# Patient Record
Sex: Female | Born: 1940 | Race: White | Hispanic: Yes | Smoking: Former smoker
Health system: Southern US, Community
[De-identification: ages and names within clinical notes are randomized; demographics above are authoritative.]

## PROBLEM LIST (undated history)

## (undated) DIAGNOSIS — J449 Chronic obstructive pulmonary disease, unspecified: Secondary | ICD-10-CM

## (undated) DIAGNOSIS — Z87891 Personal history of nicotine dependence: Secondary | ICD-10-CM

## (undated) DIAGNOSIS — E039 Hypothyroidism, unspecified: Secondary | ICD-10-CM

## (undated) DIAGNOSIS — I10 Essential (primary) hypertension: Secondary | ICD-10-CM

## (undated) DIAGNOSIS — J189 Pneumonia, unspecified organism: Secondary | ICD-10-CM

## (undated) HISTORY — DX: Chronic obstructive pulmonary disease, unspecified: J44.9

## (undated) HISTORY — PX: EYE SURGERY: SHX253

## (undated) HISTORY — DX: Personal history of nicotine dependence: Z87.891

---

## 1999-10-14 DIAGNOSIS — E785 Hyperlipidemia, unspecified: Secondary | ICD-10-CM | POA: Insufficient documentation

## 2009-12-15 DIAGNOSIS — J449 Chronic obstructive pulmonary disease, unspecified: Secondary | ICD-10-CM | POA: Insufficient documentation

## 2015-10-12 DIAGNOSIS — L439 Lichen planus, unspecified: Secondary | ICD-10-CM | POA: Insufficient documentation

## 2018-10-09 DIAGNOSIS — M1811 Unilateral primary osteoarthritis of first carpometacarpal joint, right hand: Secondary | ICD-10-CM | POA: Insufficient documentation

## 2019-01-23 ENCOUNTER — Telehealth: Payer: Self-pay | Admitting: General Surgery

## 2019-01-23 NOTE — Telephone Encounter (Signed)
Patient provided contact # [phone] for Dr. Ainsley Spinner office. She stated they were supposed to send the information by priority mail.Patient wanted to let Dr. Valeta Harms know that she had an EKG done by Dr. Carlis Abbott also.  [facility] and spoke with medical records. Advised the packet only had a copy of a chest xray CD, but last office visit notes, recent labs and the EKG mentioned by the patient were not received.  She stated the fax was sent on 01/17/19. I requested for it to be faxed again as it does not appear to have been received (checked location for all consult papers, was not there, checked multiple dates. Requested it be sent urgent and put "Dr. Ambrose Pancoast" on cover sheet.  Nothing further needed at this time.

## 2019-01-24 ENCOUNTER — Other Ambulatory Visit: Payer: Self-pay

## 2019-01-24 ENCOUNTER — Encounter: Payer: Self-pay | Admitting: Pulmonary Disease

## 2019-01-24 ENCOUNTER — Ambulatory Visit (INDEPENDENT_AMBULATORY_CARE_PROVIDER_SITE_OTHER): Payer: Medicare Other | Admitting: Pulmonary Disease

## 2019-01-24 VITALS — BP 122/88 | HR 75 | Temp 98.4°F | Ht 63.0 in | Wt 148.4 lb

## 2019-01-24 DIAGNOSIS — R911 Solitary pulmonary nodule: Secondary | ICD-10-CM

## 2019-01-24 DIAGNOSIS — R0600 Dyspnea, unspecified: Secondary | ICD-10-CM | POA: Diagnosis not present

## 2019-01-24 DIAGNOSIS — J449 Chronic obstructive pulmonary disease, unspecified: Secondary | ICD-10-CM

## 2019-01-24 DIAGNOSIS — R0609 Other forms of dyspnea: Secondary | ICD-10-CM

## 2019-01-24 DIAGNOSIS — J441 Chronic obstructive pulmonary disease with (acute) exacerbation: Secondary | ICD-10-CM

## 2019-01-24 DIAGNOSIS — R0602 Shortness of breath: Secondary | ICD-10-CM

## 2019-01-24 MED ORDER — TRELEGY ELLIPTA 100-62.5-25 MCG/INH IN AEPB: 1.0000 | INHALATION_SPRAY | Freq: Every day | RESPIRATORY_TRACT | 0 refills | Status: DC

## 2019-01-24 MED ORDER — ALBUTEROL SULFATE (2.5 MG/3ML) 0.083% IN NEBU: 2.5000 mg | INHALATION_SOLUTION | RESPIRATORY_TRACT | 3 refills | Status: DC | PRN

## 2019-01-24 NOTE — Patient Instructions (Addendum)
Thank you for visiting Dr. Valeta Harms at [facility]. Today we recommend the following:  Samples of Trelegy inhaler today  Stop the Anoro during the trial of Trelegy New prescription for a nebulizer machine  Albuterol neb solution to be used in this every 4-6 hours as needed.   We will need to get copies of the CT Chest images as well as PFTs from [facility].   Return in about 6 years (around 01/23/2025).    Please do your part to reduce the spread of COVID-19.

## 2019-01-24 NOTE — Progress Notes (Signed)
Synopsis: Referred in September 2020 for COPD/lung nodules by No ref. provider found  Subjective:   PATIENT ID: Allison Zuniga GENDER: female DOB: April 17, 1941, MRN: [id number]  Chief Complaint  Patient presents with  . Consult    Consult for Lung Nodule. She reports SOB and feeling like she can't take a deep breath which started in July. She was treated with prednisone and antibiotics which did help. She just finished levaquin this week. She reports a productive cough with yellow to green mucous.     78 year old female, former smoker, secondhand smoke exposure from husband, started smoking 89 yo, 1 packs per day, for ~45 years.  She lives in [place]. Was diagnosed with COPD some where arounf 5 years agos. Recently started on Anoro this past week. She was spiriva for for >10 years. She did have PFTs with about 3 years completed at [facility].   Back in July she had an excerbation. Developed SOB and had DOE. Just completed a course of levaquin started by her PCP. Finished her abx and steroid course last week for ongoing exacerbation symptoms.  She is feeling better since her last exacerbation.  She is only been on Anoro Ellipta approximately 1 week.  She is not sure she has seen much difference in her breathing.  She is using her albuterol inhaler 1-2 times per day.  She does not have a nebulizer at home.  Denies fevers chills night sweats weight loss.  Patient's daughter is [name].  I called and spoke with her as well.  She was appreciative of the update since she could not be present with her mother today in the office due to [place] visitor restrictions.    Past Medical History:  Diagnosis Date  . COPD (chronic obstructive pulmonary disease) ([place])   . Former smoker      Family History  Problem Relation Age of Onset  . Asthma Mother      History reviewed. No pertinent surgical history.  Social History   Socioeconomic History  . Marital status: Married    Spouse name: Not on  file  . Number of children: Not on file  . Years of education: Not on file  . Highest education level: Not on file  Occupational History  . Not on file  Social Needs  . Financial resource strain: Not on file  . Food insecurity    Worry: Not on file    Inability: Not on file  . Transportation needs    Medical: Not on file    Non-medical: Not on file  Tobacco Use  . Smoking status: Former Smoker    Packs/day: 1.00    Years: 30.00    Pack years: 30.00    Quit date: 2015    Years since quitting: 5.6  . Smokeless tobacco: Former Network engineer and Sexual Activity  . Alcohol use: Not on file  . Drug use: Not on file  . Sexual activity: Not on file  Lifestyle  . Physical activity    Days per week: Not on file    Minutes per session: Not on file  . Stress: Not on file  Relationships  . Social Herbalist on phone: Not on file    Gets together: Not on file    Attends religious service: Not on file    Active member of club or organization: Not on file    Attends meetings of clubs or organizations: Not on file    Relationship status:  Not on file  . Intimate partner violence    Fear of current or ex partner: Not on file    Emotionally abused: Not on file    Physically abused: Not on file    Forced sexual activity: Not on file  Other Topics Concern  . Not on file  Social History Narrative  . Not on file     Allergies  Allergen Reactions  . Aspirin Hives  . Penicillins      Outpatient Medications Prior to Visit  Medication Sig Dispense Refill  . atenolol (TENORMIN) 50 MG tablet Take 50 mg by mouth daily.    . Cholecalciferol (VITAMIN D3) 1.25 MG (50000 UT) CAPS Take by mouth.    . irbesartan (AVAPRO) 150 MG tablet Take 150 mg by mouth daily.    Marland Kitchen levothyroxine (SYNTHROID) 75 MCG tablet Take 75 mcg by mouth daily before breakfast.    . vitamin B-12 (CYANOCOBALAMIN) 500 MCG tablet Take 500 mcg by mouth daily.     No facility-administered medications prior to  visit.     Review of Systems  Constitutional: Negative for chills, fever, malaise/fatigue and weight loss.  HENT: Negative for hearing loss, sore throat and tinnitus.   Eyes: Negative for blurred vision and double vision.  Respiratory: Positive for cough, sputum production and shortness of breath. Negative for hemoptysis, wheezing and stridor.   Cardiovascular: Negative for chest pain, palpitations, orthopnea, leg swelling and PND.  Gastrointestinal: Negative for abdominal pain, constipation, diarrhea, heartburn, nausea and vomiting.  Genitourinary: Negative for dysuria, hematuria and urgency.  Musculoskeletal: Negative for joint pain and myalgias.  Skin: Negative for itching and rash.  Neurological: Negative for dizziness, tingling, weakness and headaches.  Endo/Heme/Allergies: Negative for environmental allergies. Does not bruise/bleed easily.  Psychiatric/Behavioral: Negative for depression. The patient is nervous/anxious. The patient does not have insomnia.   All other systems reviewed and are negative.    Objective:  Physical Exam Vitals signs reviewed.  Constitutional:      General: She is not in acute distress.    Appearance: She is well-developed.  HENT:     Head: Normocephalic and atraumatic.  Eyes:     General: No scleral icterus.    Conjunctiva/sclera: Conjunctivae normal.     Pupils: Pupils are equal, round, and reactive to light.  Neck:     Musculoskeletal: Neck supple.     Vascular: No JVD.     Trachea: No tracheal deviation.  Cardiovascular:     Rate and Rhythm: Normal rate and regular rhythm.     Heart sounds: Normal heart sounds. No murmur.  Pulmonary:     Effort: Pulmonary effort is normal. No tachypnea, accessory muscle usage or respiratory distress.     Breath sounds: Normal breath sounds. No stridor. No wheezing, rhonchi or rales.  Abdominal:     General: Bowel sounds are normal. There is no distension.     Palpations: Abdomen is soft.     Tenderness:  There is no abdominal tenderness.  Musculoskeletal:        General: No tenderness.  Lymphadenopathy:     Cervical: No cervical adenopathy.  Skin:    General: Skin is warm and dry.     Capillary Refill: Capillary refill takes less than 2 seconds.     Findings: No rash.  Neurological:     Mental Status: She is alert and oriented to person, place, and time.  Psychiatric:        Behavior: Behavior normal.  Vitals:   01/24/19 1039  BP: 122/88  Pulse: 75  Temp: 98.4 F (36.9 C)  TempSrc: Temporal  SpO2: 96%  Weight: 148 lb 6.4 oz (67.3 kg)  Height: 5\' 3"  (1.6 m)   96% on  RA BMI Readings from Last 3 Encounters:  01/24/19 26.29 kg/m   Wt Readings from Last 3 Encounters:  01/24/19 148 lb 6.4 oz (67.3 kg)     CBC No results found for: WBC, RBC, HGB, HCT, PLT, MCV, MCH, MCHC, RDW, LYMPHSABS, MONOABS, EOSABS, BASOSABS   Chest Imaging:     Chest x-ray from Dr. Ainsley Spinner office.  Density within the right lower lobe, infiltrate in the left lower lobe.  Upper lobe evidence of emphysema. The patient's images have been independently reviewed by me.    Pulmonary Functions Testing Results: No flowsheet data found.  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 DOE (dyspnea on exertion)  R06.09 CANCELED: Ambulatory Referral for DME  2. SOB (shortness of breath)  R06.02 CANCELED: Ambulatory Referral for DME  3. Lung nodule  R91.1 CANCELED: Ambulatory Referral for DME  4. Dyspnea, unspecified type  R06.00   5. COPD with acute exacerbation ([place])  J44.1   6. Chronic obstructive pulmonary disease, unspecified COPD type ([place])  J44.9 For home use only DME Nebulizer machine    Discussion:  78 year old female with dyspnea on exertion.  History of COPD.  Prior PFTs completed at [facility].  Prior CT chest for evaluation of lung nodule also  completed at [facility].  Plan: Continue Anoro Ellipta.  She was recently stepped up from [place] to LAMA/LABA.  This was only a week ago.  Anora Ellipta may be enough for her. Give samples of Trelegy Ellipta.  During the trial of Trelegy she should stop the Anoro.  Patient inquired about use of Trelegy.  Therefore we will give her samples to see if she likes this better than the Anoro.  With exacerbations recently the addition of ICS in her situation is appropriate.  New prescription given for nebulizer machine, nebulized albuterol every 4 6 hours as needed for shortness of breath and wheezing. We need to obtain actual copies of the CT images that were completed of the chest. Once we review the CT images we will be able to make further recommendations regarding the lung nodule seen on chest x-ray.  We also need to obtain pulmonary function tests that were completed at [facility] as well. Pending on the timeframe of when the last PFTs were completed we will likely have those repeated as well prior to the next office visit.  Patient to see [place] again in 6 weeks to see how she is recovering from her recent COPD exacerbation.  Greater than 50% of this patient's 45-minute office visit was been face-to-face discussing above recommendations treatment plan.   Current Outpatient Medications:  .  atenolol (TENORMIN) 50 MG tablet, Take 50 mg by mouth daily., Disp: , Rfl:  .  Cholecalciferol (VITAMIN D3) 1.25 MG (50000 UT) CAPS, Take by mouth., Disp: , Rfl:  .  irbesartan (AVAPRO) 150 MG tablet, Take 150 mg by mouth daily., Disp: , Rfl:  .  levothyroxine (SYNTHROID) 75 MCG tablet, Take 75 mcg by mouth daily before breakfast., Disp: , Rfl:  .  vitamin B-12 (CYANOCOBALAMIN) 500 MCG tablet, Take 500  mcg by mouth daily., Disp: , Rfl:  .  albuterol (PROVENTIL) (2.5 MG/3ML) 0.083% nebulizer solution, Take 3 mLs (2.5 mg total) by nebulization every 4 (four) hours as needed for wheezing or shortness of  breath (inhaler 17ml every hours and as needed for sob and wheezing)., Disp: 360 mL, Rfl: 3 .  Fluticasone-Umeclidin-Vilant (TRELEGY ELLIPTA) 100-62.5-25 MCG/INH AEPB, Inhale 1 puff into the lungs daily., Disp: 1 each, Rfl: 0   Garner Nash, DO [facility] 01/24/2019 3:12 PM

## 2019-01-27 ENCOUNTER — Telehealth: Payer: Self-pay | Admitting: Pulmonary Disease

## 2019-01-27 NOTE — Telephone Encounter (Signed)
Message routed to Dr. Valeta Harms.  I called the patient back and she said the Trelegy made her feel terrible, was not able to get a deep breath and said it did not make her feel good at all and made her chest felt heavy. Went back on the [employer].  She wants to know if she was to continue using the ventolin and albuterol solution? Should they be used along with the Anoro? If she should use both the Ventolin an albuterol solution, how closely can they be used together?

## 2019-01-28 NOTE — Telephone Encounter (Signed)
The albuterol can be used is either form. The ventolin inhaler of the nebulizer every 4-6 hours as needed.   I have serious doubt the trelegy made these symptoms. Trelegy contains the exact same medication as Anoro Except for the addition of an inhaled steroid which is not a short acting medication and takes days of use to see any changes.  Thanks  BLI

## 2019-01-28 NOTE — Telephone Encounter (Signed)
Call made to patient, made aware of BI recommendations. Voiced understanding. Nothing further needed at this time. She also states she also requested a disc of her EKG to be sent as well. Made her aware I would let BI know. Voiced understanding. Nothing further needed at this time.

## 2019-01-31 ENCOUNTER — Telehealth: Payer: Self-pay | Admitting: Pulmonary Disease

## 2019-01-31 NOTE — Telephone Encounter (Signed)
Call returned to patient, she wanted to be sure we requested her CT disc as well as other records. I made her aware the request has been faxed.She reports she called and they have not received it. I made her aware I would re-fax the request. Voiced understanding. Nothing further needed at this time.

## 2019-02-05 ENCOUNTER — Telehealth: Payer: Self-pay

## 2019-02-05 DIAGNOSIS — R0602 Shortness of breath: Secondary | ICD-10-CM

## 2019-02-05 DIAGNOSIS — J449 Chronic obstructive pulmonary disease, unspecified: Secondary | ICD-10-CM

## 2019-02-05 MED ORDER — TRELEGY ELLIPTA 100-62.5-25 MCG/INH IN AEPB: 1.0000 | INHALATION_SPRAY | Freq: Once | RESPIRATORY_TRACT | 6 refills | Status: AC

## 2019-02-05 NOTE — Telephone Encounter (Signed)
-----   Message from Garner Nash, DO sent at 02/04/2019  5:30 PM EDT ----- Regarding: Allison Zuniga,   The patients daughter (my neighbor) told me that she brought a copy of the CD with the cat scan on it to our office.   Please, look for the cd and once you find it please call the daughter [name] that we have it and I will take a look at it when I am back in the office. Let her know that I am working in the hospital until oct 5th I believe.   Also, it would be good to call and check on the patient if you could she told me she had to go to her PCP again for SOB.   Thanks  Leory Plowman   I hope everything is going well.

## 2019-02-05 NOTE — Telephone Encounter (Signed)
PCCM:  CT Scan from [facility] in [place] was reviewed.   Areas of tree-in-bud opacities, bronchiectasis and scaring the bases. Possible resolving pneumonia.  Also, posterior left lower lobe soft tissue lesion in the base that needs to be followed up. Also, 64mm RLL nodule that needs 3 month follow up.   We need the images loaded in PACs for radiology to be able to compare. I will leave this CD in my box on my desk for upload.   Patient to follow up in clinic after HRCT complete in 3 months. New order placed for this.   Patient would also like to establish with a [facility]ologist. I have placed this referral. 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   I called and spoke with the patient as well as her daughter [name].   New refill for trelegy sent to [facility] 02/05/2019 2:17 PM

## 2019-02-05 NOTE — Telephone Encounter (Signed)
Call made to patient, she reports she has not seen her PCP following her visit after seeing Dr. Valeta Harms. She report she did go and have a covid test on Monday, still awaiting results. She reports she has not had any other symptoms. She report she is just having a lot of anxiety while waiting on review of the CT disc. She reports she is stable for now just having a lot of anxiety. Made aware I would let BI know. She also states she is aware you BI is not in clinic everyday and she asked when BI would be back in clinic. I explained that he is due back in clinic in 10/5 but that does not mean he will not be able to view the disc until then. She became overly anxious explaining how far away that is to wait and is there anything else we can do in the meantime. I again inquired as to whether she felt she needed to be seen again, she reports no she wanted to wait until BI reviewed the disc. She reports she is using the Trelegy and the nebulizer. Able to speak complete sentences. Denies cough. Reports her SOB has not gotten any worse since seeing BI. Denies wheezing. I again explained if she needed to be seen just call. She was reluctant to respond. I made her aware I would get this message to [facility], she states please get it to him as quick as you can.

## 2019-02-21 ENCOUNTER — Other Ambulatory Visit: Payer: Self-pay

## 2019-02-21 ENCOUNTER — Ambulatory Visit
Admission: RE | Admit: 2019-02-21 | Discharge: 2019-02-21 | Disposition: A | Discharge status: Home / Self Care | Payer: Self-pay | Source: Ambulatory Visit | Attending: Pulmonary Disease | Admitting: Pulmonary Disease

## 2019-02-21 DIAGNOSIS — R0600 Dyspnea, unspecified: Secondary | ICD-10-CM

## 2019-02-21 DIAGNOSIS — R0609 Other forms of dyspnea: Secondary | ICD-10-CM

## 2019-03-12 ENCOUNTER — Ambulatory Visit (INDEPENDENT_AMBULATORY_CARE_PROVIDER_SITE_OTHER): Payer: Medicare Other | Admitting: Pulmonary Disease

## 2019-03-12 ENCOUNTER — Other Ambulatory Visit: Payer: Self-pay

## 2019-03-12 ENCOUNTER — Encounter: Payer: Self-pay | Admitting: Pulmonary Disease

## 2019-03-12 VITALS — BP 144/70 | HR 79 | Temp 97.2°F | Ht 63.0 in | Wt 149.6 lb

## 2019-03-12 DIAGNOSIS — M791 Myalgia, unspecified site: Secondary | ICD-10-CM | POA: Diagnosis not present

## 2019-03-12 DIAGNOSIS — R911 Solitary pulmonary nodule: Secondary | ICD-10-CM | POA: Diagnosis not present

## 2019-03-12 DIAGNOSIS — J42 Unspecified chronic bronchitis: Secondary | ICD-10-CM | POA: Diagnosis not present

## 2019-03-12 DIAGNOSIS — T887XXA Unspecified adverse effect of drug or medicament, initial encounter: Secondary | ICD-10-CM

## 2019-03-12 DIAGNOSIS — J449 Chronic obstructive pulmonary disease, unspecified: Secondary | ICD-10-CM | POA: Diagnosis not present

## 2019-03-12 NOTE — Progress Notes (Signed)
Synopsis: Referred in September 2020 for COPD/lung nodules by Mosetta Anis, MD  Subjective:   PATIENT ID: Allison Zuniga GENDER: female DOB: 1941-04-07, MRN: [id number]  Chief Complaint  Patient presents with   Follow-up    78 year old female, former smoker, secondhand smoke exposure from husband, started smoking 37 yo, 1 packs per day, for ~45 years.  She lives in [place]. Was diagnosed with COPD some where arounf 5 years agos. Recently started on Anoro this past week. She was spiriva for for >10 years. She did have PFTs with about 3 years completed at [facility].   Back in July she had an excerbation. Developed SOB and had DOE. Just completed a course of levaquin started by her PCP. Finished her abx and steroid course last week for ongoing exacerbation symptoms.  She is feeling better since her last exacerbation.  She is only been on Anoro Ellipta approximately 1 week.  She is not sure she has seen much difference in her breathing.  She is using her albuterol inhaler 1-2 times per day.  She does not have a nebulizer at home.  Denies fevers chills night sweats weight loss.  Patient's daughter is [name].  I called and spoke with her as well.  She was appreciative of the update since she could not be present with her mother today in the office due to [place] visitor restrictions.   OV 03/12/2019: Here today for follow-up regarding COPD management.  Since last office visit was able to obtain CT imaging from [facility] in [place].  Review of the images revealed some areas of tree-in-bud opacities mild bronchiectasis and scarring at the bases.  She was also found to have an 8 mm right lower lobe lung nodule.  We discussed this via telephone back in September.  She has planned HRCT follow-up in 3 months.  Echocardiogram revealed mild AI.  She was referred to cardiology.  This appointment is scheduled for October 30.  Patient doing well today.  She states that she is breathing much  better than where she was before.  She likes being on the Trelegy.  She does feel as if she has having recurrent muscle cramps in her legs and lower back and hips.  She is worried that this may be related to the initiation of the Trelegy inhaler.  However she is breathing much better than she was and does not want to go off if at all possible.   Past Medical History:  Diagnosis Date   COPD (chronic obstructive pulmonary disease) ([place])    Former smoker      Family History  Problem Relation Age of Onset   Asthma Mother      No past surgical history on file.  Social History   Socioeconomic History   Marital status: Married    Spouse name: Not on file   Number of children: Not on file   Years of education: Not on file   Highest education level: Not on file  Occupational History   Not on file  Social Needs   Financial resource strain: Not on file   Food insecurity    Worry: Not on file    Inability: Not on file   Transportation needs    Medical: Not on file    Non-medical: Not on file  Tobacco Use   Smoking status: Former Smoker    Packs/day: 1.00    Years: 30.00    Pack years: 30.00    Quit date: 2015  Years since quitting: 5.8   Smokeless tobacco: Former Network engineer and Sexual Activity   Alcohol use: Not on file   Drug use: Not on file   Sexual activity: Not on file  Lifestyle   Physical activity    Days per week: Not on file    Minutes per session: Not on file   Stress: Not on file  Relationships   Social connections    Talks on phone: Not on file    Gets together: Not on file    Attends religious service: Not on file    Active member of club or organization: Not on file    Attends meetings of clubs or organizations: Not on file    Relationship status: Not on file   Intimate partner violence    Fear of current or ex partner: Not on file    Emotionally abused: Not on file    Physically abused: Not on file    Forced sexual activity:  Not on file  Other Topics Concern   Not on file  Social History Narrative   Not on file     Allergies  Allergen Reactions   Aspirin Hives   Penicillins      Outpatient Medications Prior to Visit  Medication Sig Dispense Refill   albuterol (VENTOLIN HFA) 108 (90 Base) MCG/ACT inhaler Inhale into the lungs every 6 (six) hours as needed for wheezing or shortness of breath.     atenolol (TENORMIN) 50 MG tablet Take 50 mg by mouth 2 (two) times daily.      Cholecalciferol (VITAMIN D3) 1.25 MG (50000 UT) CAPS Take by mouth.     Cyanocobalamin 1000 MCG SUBL Place 1,000 mcg under the tongue daily.     Fluticasone-Umeclidin-Vilant (TRELEGY ELLIPTA) 100-62.5-25 MCG/INH AEPB Inhale 1 puff into the lungs daily. 1 each 0   irbesartan (AVAPRO) 150 MG tablet Take 150 mg by mouth daily.     levothyroxine (SYNTHROID) 75 MCG tablet Take 75 mcg by mouth daily before breakfast.     LORazepam (ATIVAN) 0.5 MG tablet TAKE 1 TABLET BY MOUTH ONCE DAILY AS NEEDED FOR ANXIETY     cyanocobalamin 1000 MCG tablet Take 1,000 mcg by mouth daily.     vitamin B-12 (CYANOCOBALAMIN) 500 MCG tablet Take 500 mcg by mouth daily.     albuterol (PROVENTIL) (2.5 MG/3ML) 0.083% nebulizer solution Take 3 mLs (2.5 mg total) by nebulization every 4 (four) hours as needed for wheezing or shortness of breath (inhaler 41ml every hours and as needed for sob and wheezing). 360 mL 3   cyanocobalamin 100 MCG tablet Take by mouth.     No facility-administered medications prior to visit.     Review of Systems  Constitutional: Negative for chills, fever, malaise/fatigue and weight loss.  HENT: Negative for hearing loss, sore throat and tinnitus.   Eyes: Negative for blurred vision and double vision.  Respiratory: Positive for cough and shortness of breath. Negative for hemoptysis, sputum production, wheezing and stridor.   Cardiovascular: Negative for chest pain, palpitations, orthopnea, leg swelling and PND.    Gastrointestinal: Negative for abdominal pain, constipation, diarrhea, heartburn, nausea and vomiting.  Genitourinary: Negative for dysuria, hematuria and urgency.  Musculoskeletal: Negative for joint pain and myalgias.  Skin: Negative for itching and rash.  Neurological: Negative for dizziness, tingling, weakness and headaches.  Endo/Heme/Allergies: Negative for environmental allergies. Does not bruise/bleed easily.  Psychiatric/Behavioral: Negative for depression. The patient is not nervous/anxious and does not have insomnia.  All other systems reviewed and are negative.    Objective:  Physical Exam Vitals signs reviewed.  Constitutional:      General: She is not in acute distress.    Appearance: She is well-developed.  HENT:     Head: Normocephalic and atraumatic.  Eyes:     General: No scleral icterus.    Conjunctiva/sclera: Conjunctivae normal.     Pupils: Pupils are equal, round, and reactive to light.  Neck:     Musculoskeletal: Neck supple.     Vascular: No JVD.     Trachea: No tracheal deviation.  Cardiovascular:     Rate and Rhythm: Normal rate and regular rhythm.     Heart sounds: Normal heart sounds. No murmur.  Pulmonary:     Effort: Pulmonary effort is normal. No tachypnea, accessory muscle usage or respiratory distress.     Breath sounds: No stridor. No wheezing, rhonchi or rales.  Abdominal:     General: Bowel sounds are normal. There is no distension.     Palpations: Abdomen is soft.     Tenderness: There is no abdominal tenderness.  Musculoskeletal:        General: No tenderness.  Lymphadenopathy:     Cervical: No cervical adenopathy.  Skin:    General: Skin is warm and dry.     Capillary Refill: Capillary refill takes less than 2 seconds.     Findings: No rash.  Neurological:     Mental Status: She is alert and oriented to person, place, and time.  Psychiatric:        Behavior: Behavior normal.      Vitals:   03/12/19 1134  BP: (!) 144/70   Pulse: 79  Temp: (!) 97.2 F (36.2 C)  TempSrc: Oral  SpO2: 96%  Weight: 149 lb 9.6 oz (67.9 kg)  Height: 5\' 3"  (1.6 m)   96% on  RA BMI Readings from Last 3 Encounters:  03/12/19 26.50 kg/m  01/24/19 26.29 kg/m   Wt Readings from Last 3 Encounters:  03/12/19 149 lb 9.6 oz (67.9 kg)  01/24/19 148 lb 6.4 oz (67.3 kg)     CBC No results found for: WBC, RBC, HGB, HCT, PLT, MCV, MCH, MCHC, RDW, LYMPHSABS, MONOABS, EOSABS, BASOSABS   Chest Imaging:     Chest x-ray from Dr. Ainsley Spinner office.  Density within the right lower lobe, infiltrate in the left lower lobe.  Upper lobe evidence of emphysema. The patient's images have been independently reviewed by me.     Pulmonary Functions Testing Results: No flowsheet data found.  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 Nodule of lower lobe of right lung  R91.1   2. Chronic bronchitis, unspecified chronic bronchitis type ([place])  J42   3. Chronic obstructive pulmonary disease, unspecified COPD type ([place])  J44.9   4. Medication side effect  T88.7XXA   5. Myalgia  M79.10     Discussion:  78 year old with dyspnea on exertion, history of COPD, prior PFTs at [facility] hospital.  I do not have a report from these.  Prior CT chest was completed at [facility] which revealed a 8 mm right lower lobe lung nodule.  Plan follow-up for this lung nodule is scheduled already for November 2020.  Mild AI on echo from [facility].  Cardiology appointment pending.  Plan: Place  keep appointment with cardiology scheduled for Friday. As for the muscle cramps and myalgia symptoms.  This could very well be related to the long-acting muscarinic agent located in her Trelegy. We will change the medication due to potential side effect. We will start Breztri Patient was given samples of this medication today. She is going to  trial herself off of the Trelegy and start the new medication to see if her myalgia muscle cramp type symptoms dissipate. I will call the patient with results of her CAT scan in November. At that point we will make plans as to what to do with the lung nodule.  If stable we will continue to follow.  Return to clinic in 6 months or as needed depending on symptoms.  Greater than 50% of this patient's 25-minute of visit was spent face-to-face discussing the above recommendations and treatment plan.    Current Outpatient Medications:    albuterol (VENTOLIN HFA) 108 (90 Base) MCG/ACT inhaler, Inhale into the lungs every 6 (six) hours as needed for wheezing or shortness of breath., Disp: , Rfl:    atenolol (TENORMIN) 50 MG tablet, Take 50 mg by mouth 2 (two) times daily. , Disp: , Rfl:    Cholecalciferol (VITAMIN D3) 1.25 MG (50000 UT) CAPS, Take by mouth., Disp: , Rfl:    Cyanocobalamin 1000 MCG SUBL, Place 1,000 mcg under the tongue daily., Disp: , Rfl:    Fluticasone-Umeclidin-Vilant (TRELEGY ELLIPTA) 100-62.5-25 MCG/INH AEPB, Inhale 1 puff into the lungs daily., Disp: 1 each, Rfl: 0   irbesartan (AVAPRO) 150 MG tablet, Take 150 mg by mouth daily., Disp: , Rfl:    levothyroxine (SYNTHROID) 75 MCG tablet, Take 75 mcg by mouth daily before breakfast., Disp: , Rfl:    LORazepam (ATIVAN) 0.5 MG tablet, TAKE 1 TABLET BY MOUTH ONCE DAILY AS NEEDED FOR ANXIETY, Disp: , Rfl:    albuterol (PROVENTIL) (2.5 MG/3ML) 0.083% nebulizer solution, Take 3 mLs (2.5 mg total) by nebulization every 4 (four) hours as needed for wheezing or shortness of breath (inhaler 76ml every hours and as needed for sob and wheezing)., Disp: 360 mL, Rfl: 3   Garner Nash, DO [facility] 03/12/2019 12:03 PM

## 2019-03-12 NOTE — Patient Instructions (Addendum)
Thank you for visiting Dr. Valeta Harms at [facility]. Today we recommend the following:  Stop trelegy on while trying the new inhaler.  Let us know about your symptoms.   Return in about 6 months (around 09/10/2019) for with APP or Dr. Valeta Harms.    Please do your part to reduce the spread of COVID-19.

## 2019-03-13 ENCOUNTER — Telehealth: Payer: Self-pay | Admitting: Pulmonary Disease

## 2019-03-13 MED ORDER — BREZTRI AEROSPHERE 160-9-4.8 MCG/ACT IN AERO: 2.0000 | INHALATION_SPRAY | Freq: Two times a day (BID) | RESPIRATORY_TRACT | 1 refills | Status: DC

## 2019-03-13 NOTE — Telephone Encounter (Signed)
Spoke with patient she did not know if inhaler was 1 puff or two and how many times a day.   I instructed her on proper way to take inhaler. She voiced understanding back.   She also would like a RX sent to her pharmacy because they do not carry it and will have to order and patient doesn't know how much it will cost her,   Dr. Valeta Harms may I send RX x1 inhaler to her pharmacy so they can run price on inhaler for her

## 2019-03-13 NOTE — Telephone Encounter (Signed)
PCCM:  2 puffs twice daily Okay to send prescription to [facility] 03/13/2019 12:46 PM

## 2019-03-13 NOTE — Telephone Encounter (Signed)
Rx Sent for pharmacy to process cost for patient

## 2019-03-14 ENCOUNTER — Ambulatory Visit (INDEPENDENT_AMBULATORY_CARE_PROVIDER_SITE_OTHER): Payer: Medicare Other | Admitting: Cardiology

## 2019-03-14 ENCOUNTER — Other Ambulatory Visit: Payer: Self-pay

## 2019-03-14 ENCOUNTER — Encounter: Payer: Self-pay | Admitting: Cardiology

## 2019-03-14 VITALS — BP 138/70 | HR 82 | Temp 95.0°F | Ht 63.0 in | Wt 150.0 lb

## 2019-03-14 DIAGNOSIS — R931 Abnormal findings on diagnostic imaging of heart and coronary circulation: Secondary | ICD-10-CM | POA: Diagnosis not present

## 2019-03-14 DIAGNOSIS — Z7189 Other specified counseling: Secondary | ICD-10-CM | POA: Diagnosis not present

## 2019-03-14 DIAGNOSIS — I351 Nonrheumatic aortic (valve) insufficiency: Secondary | ICD-10-CM | POA: Diagnosis not present

## 2019-03-14 DIAGNOSIS — I10 Essential (primary) hypertension: Secondary | ICD-10-CM

## 2019-03-14 DIAGNOSIS — R0602 Shortness of breath: Secondary | ICD-10-CM | POA: Diagnosis not present

## 2019-03-14 NOTE — Patient Instructions (Signed)
Medication Instructions:  Your Physician recommend you continue on your current medication as directed.    *If you need a refill on your cardiac medications before your next appointment, please call your pharmacy*  Lab Work: None  Testing/Procedures: None  Follow-Up: At [facility], you and your health needs are our priority.  As part of our continuing mission to provide you with exceptional heart care, we have created designated Provider Care Teams.  These Care Teams include your primary Cardiologist (physician) and Advanced Practice Providers (APPs -  Physician Assistants and Nurse Practitioners) who all work together to provide you with the care you need, when you need it.  Your next appointment:   12 months  The format for your next appointment:   In Person  Provider:   Bridgette Christopher, MD    

## 2019-03-14 NOTE — Progress Notes (Signed)
Cardiology Office Note:    Date:  03/14/2019   ID:  Allison Zuniga, DOB 1940-06-08, MRN [id number]  PCP:  Mosetta Anis, MD  Cardiologist:  Buford Dresser, MD  Referring MD: Garner Nash, DO   Chief Complaint  Patient presents with  . New Patient (Initial Visit)    History of Present Illness:    Allison Zuniga is a 78 y.o. female with a hx of COPD, prior tobacco use who is seen as a new consult at the request of [place]card, [place], DO for the evaluation and management of shortness of breath, mild AI, EF 50% on prior echo.  We reviewed her recent history at length. She has had COPD for years, but sometime around August of this year noted increased wheezing and dyspnea. Had an echocardiogram with her PCP Dr. Carlis Abbott through [facility]. At time of office visit, pulse ox showed O2 sat of 85%. Reported cough. Referred to Dr. Valeta Harms for further evaluation. She feels excellent on trelegy from a respiratory standpoint, but she has had low back/gluteal pain with this, which is apparently a side effect. She was recommended to switch to breztri inhaler but has not done this yet.  From a cardiovascular standpoint, she denies prior history. We reviewed her echo report at length. She asked that her daughter and husband also be presented via conference call, and this was done today. I cannot see images, but I reviewed echo report at length, interpreting findings with her on 3D heart model today.  Echo 01/14/2019 (report only, I cannot see images): 2D Findings  Aortic Root: Normal  Aortic Leaflets: Normal  Leaflet mobility: Normal  Left Ventricle Size: Normal  Systolic Function: mildly dec reased with EF estimated 50% Wall Thickness: Normal  Mitral Valve: Normal  Left Atrium: Normal  Right Ventricle Size: Normal  Right Atrium: Normal  Tricuspid Valve: Normal Pulmonic Valve: Normal Atrial Septum: Normal Ventricular Septum: Normal Pericardium: Normal Pericardial effusion: None  Intracardiac mass/thrombus: None Endocardial vegetations: None  Doppler  LVOT Peak Velocity: 0.98 m/sec Gradient: 3.8 mmHg  Aortic Valve Insufficiency: mild PHT: 483 m/sec Peak Velocity: 1.32 m/sec Peak Gradient: 7.0 mmHg Mean Gradient: 3.2 mmHg Ao VTI: 24 cm Ao Valve Area: 2.72 cm2 CO: 4.3 L/min SV: 65 mL  Pulmonic Valve Insufficiency: no Peak Gradient: 1.0 mmHg  Mitral Valve Regurgitation: no MV Area: 3.28 cm2 PHT: 67 msec E Vel: 0.62 m/sec A Vel: 0.62 m/sec E/A Ratio: 1.0  Diastolic Pattern: normal  Tricuspid Valve Regurgitation: no TR Max Velocity: n/a m/s IVC less than 2.1 cm with greater than 50 % collapse Estimated RA Pressure: 10 mmHg Estimated PA Pressure (RVESP): 35 mmHg  Imaging Windows: Parasternal: fair Apical: fair Subcostal: fair  IMPRESSION/SUMMARY:  Mild aortic insufficiency Mildly decreased EF  Of note, we discussed that normal EF is 55-65%, and her was noted as 50%. No focal wall motion abnormalities noted. Diastolic pattern was noted as normal. AI only mild.  She denies known CAD or prior heart disease. We discussed normal and abnormal findings at length today.  Denies chest pain, shortness of breath at rest or with normal exertion. No PND, orthopnea, LE edema or unexpected weight gain. No syncope or palpitations.  Past Medical History:  Diagnosis Date  . COPD (chronic obstructive pulmonary disease) ([place])   . Former smoker     History reviewed. No pertinent surgical history.  Current Medications: Current Outpatient Medications on File Prior to Visit  Medication Sig  . albuterol (VENTOLIN HFA) 108 (90 Base)  MCG/ACT inhaler Inhale into the lungs every 6 (six) hours as needed for wheezing or shortness of breath.  . Ascorbic Acid (VITAMIN C) 1000 MG tablet Take 1,000 mg by mouth daily.  Marland Kitchen atenolol (TENORMIN) 50 MG tablet Take 50 mg by mouth 2 (two) times daily.   . Budeson-Glycopyrrol-Formoterol (BREZTRI AEROSPHERE) 160-9-4.8  MCG/ACT AERO Inhale 2 puffs into the lungs 2 (two) times daily.  . Cholecalciferol (VITAMIN D3) 1.25 MG (50000 UT) CAPS Take 50,000 Units by mouth once a week.   . clindamycin (CLEOCIN) 150 MG capsule Take 300 mg by mouth 2 (two) times daily.  . Cyanocobalamin 1000 MCG SUBL Place 1,000 mcg under the tongue daily.  . Fluticasone-Umeclidin-Vilant (TRELEGY ELLIPTA) 100-62.5-25 MCG/INH AEPB Inhale 1 puff into the lungs daily.  . irbesartan (AVAPRO) 150 MG tablet Take 150 mg by mouth daily.  Marland Kitchen levothyroxine (SYNTHROID) 75 MCG tablet Take 75 mcg by mouth daily before breakfast.  . LORazepam (ATIVAN) 0.5 MG tablet TAKE 1 TABLET BY MOUTH ONCE DAILY AS NEEDED FOR ANXIETY   No current facility-administered medications on file prior to visit.      Allergies:   Aspirin and Penicillins   Social History   Tobacco Use  . Smoking status: Former Smoker    Packs/day: 1.00    Years: 30.00    Pack years: 30.00    Quit date: 2015    Years since quitting: 5.8  . Smokeless tobacco: Former Network engineer Use Topics  . Alcohol use: Not on file  . Drug use: Not on file    Family History: family history includes Asthma in her mother.  ROS:   Please see the history of present illness.  Additional pertinent ROS: Constitutional: Negative for chills, fever, night sweats, unintentional weight loss  HENT: Negative for ear pain and hearing loss.   Eyes: Negative for loss of vision and eye pain.  Respiratory: Positive for improving cough, sputum, wheezing.   Cardiovascular: See HPI. Gastrointestinal: Negative for abdominal pain, melena, and hematochezia.  Genitourinary: Negative for dysuria and hematuria.  Musculoskeletal: Negative for falls and myalgias.  Skin: Negative for itching and rash.  Neurological: Negative for focal weakness, focal sensory changes and loss of consciousness.  Endo/Heme/Allergies: Does not bruise/bleed easily.     EKGs/Labs/Other Studies Reviewed:    The following studies were  reviewed today: Echo as above  EKG:  EKG is personally reviewed.  The ekg ordered today demonstrates NSR  Recent Labs: No results found for requested labs within last 8760 hours.  Recent Lipid Panel No results found for: CHOL, TRIG, HDL, CHOLHDL, VLDL, LDLCALC, LDLDIRECT  Physical Exam:    VS:  BP 138/70 (BP Location: Left Arm, Patient Position: Sitting, Cuff Size: Normal)   Pulse 82   Temp (!) 95 F (35 C)   Ht 5\' 3"  (1.6 m)   Wt 150 lb (68 kg)   BMI 26.57 kg/m     Wt Readings from Last 3 Encounters:  03/14/19 150 lb (68 kg)  03/12/19 149 lb 9.6 oz (67.9 kg)  01/24/19 148 lb 6.4 oz (67.3 kg)    GEN: Well nourished, well developed in no acute distress HEENT: Normal, moist mucous membranes NECK: No JVD CARDIAC: regular rhythm, normal S1 and S2, no rubs or gallops. No murmurs. VASCULAR: Radial and DP pulses 2+ bilaterally. No carotid bruits RESPIRATORY:  Clear to auscultation without rales, wheezing or rhonchi  ABDOMEN: Soft, non-tender, non-distended MUSCULOSKELETAL:  Ambulates independently SKIN: Warm and dry, no edema NEUROLOGIC:  Alert and oriented x 3. No focal neuro deficits noted. PSYCHIATRIC:  Normal affect    ASSESSMENT:    1. SOB (shortness of breath)   2. Nonrheumatic aortic valve insufficiency   3. Abnormal echocardiogram   4. Cardiac risk counseling   5. Counseling on health promotion and disease prevention   6. Essential hypertension    PLAN:    Shortness of breath: suspect predominantly pulmonary in origin. Lungs clear today, feeling much improved -management of COPD per Dr. Valeta Harms -no suggestion of diastolic dysfunction on echo, euvolemic today -mild systolic dysfunction, though no chest pain, no history, and no comment on focal wall motion abnormalities. Monitor, discussed red flag warning signs that need immediate medical attention -aortic regurgitation only mild -ECG unremarkable, denies history of arrhythmia.  Hypertension: no additional risk  factors. Overall goal <130/80, reasonably close today. Continue to monitor -continue irbesartan -no cardiac necessity for beta blocker, if needed could wean atenolol (esp given COPD) and use chlorthalidone or amlodipine instead  Cardiac risk counseling and prevention recommendations: -recommend heart healthy/Mediterranean diet, with whole grains, fruits, vegetable, fish, lean meats, nuts, and olive oil. Limit salt. -recommend moderate walking, 3-5 times/week for 30-50 minutes each session. Aim for at least 150 minutes.week. Goal should be pace of 3 miles/hours, or walking 1.5 miles in 30 minutes -recommend avoidance of tobacco products. Avoid excess alcohol. -no known ASCVD, and she is beyond age guidelines for lipid management.   Plan for follow up: 1 year or sooner PRN  Medication Adjustments/Labs and Tests Ordered: Current medicines are reviewed at length with the patient today.  Concerns regarding medicines are outlined above.  Orders Placed This Encounter  Procedures  . EKG 12-Lead   No orders of the defined types were placed in this encounter.   Patient Instructions  Medication Instructions:  Your Physician recommend you continue on your current medication as directed.    *If you need a refill on your cardiac medications before your next appointment, please call your pharmacy*  Lab Work: None  Testing/Procedures: None  Follow-Up: At [facility], you and your health needs are our priority.  As part of our continuing mission to provide you with exceptional heart care, we have created designated Provider Care Teams.  These Care Teams include your primary Cardiologist (physician) and Advanced Practice Providers (APPs -  Physician Assistants and Nurse Practitioners) who all work together to provide you with the care you need, when you need it.  Your next appointment:   12 months  The format for your next appointment:   In Person  Provider:   Buford Dresser, MD       Signed, Buford Dresser, MD PhD 03/14/2019 10:09 PM    [place]

## 2019-03-28 ENCOUNTER — Other Ambulatory Visit: Payer: Self-pay | Admitting: Pulmonary Disease

## 2019-03-28 DIAGNOSIS — R0602 Shortness of breath: Secondary | ICD-10-CM

## 2019-03-28 DIAGNOSIS — J849 Interstitial pulmonary disease, unspecified: Secondary | ICD-10-CM

## 2019-04-01 ENCOUNTER — Ambulatory Visit
Admission: RE | Admit: 2019-04-01 | Discharge: 2019-04-01 | Disposition: A | Discharge status: Home / Self Care | Payer: Medicare Other | Source: Ambulatory Visit | Attending: Pulmonary Disease | Admitting: Pulmonary Disease

## 2019-04-01 DIAGNOSIS — J849 Interstitial pulmonary disease, unspecified: Secondary | ICD-10-CM

## 2019-04-01 DIAGNOSIS — R0602 Shortness of breath: Secondary | ICD-10-CM

## 2019-04-02 ENCOUNTER — Telehealth: Payer: Self-pay | Admitting: Pulmonary Disease

## 2019-04-02 NOTE — Telephone Encounter (Signed)
Call returned to patient, made aware of recommendations per BI. Voiced understanding.   Nothing further needed at this time.

## 2019-04-02 NOTE — Telephone Encounter (Signed)
PCCM:  I doubt these symptoms are related to her inhalers. But she should follow up with her PCP. Unlikely to affect her arthritis.   Garner Nash, DO [facility] 04/02/2019 2:49 PM

## 2019-04-02 NOTE — Telephone Encounter (Signed)
Call returned to patient, confirmed DOB, requesting results of her CT:  Notes recorded by Garner Nash, DO on 04/02/2019 at 2:13 PM EST  Allison Zuniga, you can let her know that her ct scan is stable from her previous. Many of the chronic changes are still present. She needs a repeat HRCT in 6 months. Please order this. I can see her back after this images are complete. Also ok to send her a copy of the report.   Made aware of results per BI. She states she is having some pain in her upper back that feels like its her lungs. She states she is concerned that the inhalers are causing it. She states she was switched from the Trelegy to [place] and it did not help. Requesting recommendations. Appt offered, declined, stating she is 2 hours away in [place]. She is concerned the inhalers are aggravating her arthritis. She states her breathing is fine. States since she started the inhalers it seems she is having random swelling in her joints and she is not urinating as much.   BI please advise. Thanks.

## 2019-04-08 ENCOUNTER — Other Ambulatory Visit: Payer: Medicare Other

## 2019-04-09 ENCOUNTER — Encounter: Payer: Self-pay | Admitting: Pulmonary Disease

## 2019-04-09 NOTE — Progress Notes (Unsigned)
PCCM:   I called patient regarding her current inhaler regimen.  She's having trouble with urinary retention. Recent ER visit with acute cystitis. She was also having muscle aches after switching to trelegy inhaler even though her breathing was much better. She moved to breztri still having muscle aches and urinary retention.   We will stop all medication with LAMA.   Started breo 100, 1 puff once daily. I called the prescription in to her pharmacy, [facility].  She was thankful for call.  BLI

## 2019-04-14 ENCOUNTER — Inpatient Hospital Stay: Admission: RE | Admit: 2019-04-14 | Payer: Medicare Other | Source: Ambulatory Visit

## 2019-09-08 DIAGNOSIS — M459 Ankylosing spondylitis of unspecified sites in spine: Secondary | ICD-10-CM | POA: Insufficient documentation

## 2019-09-08 DIAGNOSIS — M47819 Spondylosis without myelopathy or radiculopathy, site unspecified: Secondary | ICD-10-CM | POA: Insufficient documentation

## 2019-09-11 ENCOUNTER — Ambulatory Visit: Payer: Self-pay | Admitting: Orthopedic Surgery

## 2019-09-18 NOTE — Progress Notes (Addendum)
Your procedure is scheduled on Wednesday MAY 12.  Report to [facility]un[facility] Main Entrance "A" at 12:15 P.M., and check in at the Admitting office.  Call this number if you have problems the morning of surgery: [phone]  Call [phone] if you have any questions prior to your surgery date Monday-Friday 8am-4pm   Remember: Do not eat or drink after midnight the night before your surgery  Take these medicines the morning of surgery with A SIP OF WATER: atenolol (TENORMIN)  levothyroxine (SYNTHROID) tiotropium (SPIRIVA) 18 MCG inhalation  If needed: acetaminophen (TYLENOL)  albuterol (VENTOLIN HFA) inhaler ---- Please bring all inhalers with you the day of surgery.  Eye drops   7 days prior to surgery STOP taking any diclofenac Sodium (VOLTAREN), meloxicam (MOBIC), Aspirin (unless otherwise instructed by your surgeon), Aleve, Naproxen, Ibuprofen, Motrin, Advil, Goody's, BC's, all herbal medications, fish oil, and all vitamins.    The Morning of Surgery  Do not wear jewelry, make-up or nail polish.  Do not wear lotions, powders, perfumes, or deodorant  Do not shave 48 hours prior to surgery.    Do not bring valuables to the hospital.  [facility] is not responsible for any belongings or valuables.  If you are a smoker, DO NOT Smoke 24 hours prior to surgery  If you wear a CPAP at night please bring your mask the morning of surgery   Remember that you must have someone to transport you home after your surgery, and remain with you for 24 hours if you are discharged the same day.   Please bring cases for contacts, glasses, hearing aids, dentures or bridgework because it cannot be worn into surgery.    Leave your suitcase in the car.  After surgery it may be brought to your room.  For patients admitted to the hospital, discharge time will be determined by your treatment team.  Patients discharged the day of surgery will not be allowed to drive home.    Special  instructions:   [place]- Preparing For Surgery  Before surgery, you can play an important role. Because skin is not sterile, your skin needs to be as free of germs as possible. You can reduce the number of germs on your skin by washing with CHG (chlorahexidine gluconate) Soap before surgery.  CHG is an antiseptic cleaner which kills germs and bonds with the skin to continue killing germs even after washing.    Oral Hygiene is also important to reduce your risk of infection.  Remember - BRUSH YOUR TEETH THE MORNING OF SURGERY WITH YOUR REGULAR TOOTHPASTE  Please do not use if you have an allergy to CHG or antibacterial soaps. If your skin becomes reddened/irritated stop using the CHG.  Do not shave (including legs and underarms) for at least 48 hours prior to first CHG shower. It is OK to shave your face.  Please follow these instructions carefully.   1. Shower the NIGHT BEFORE SURGERY and the MORNING OF SURGERY with CHG Soap.   2. If you chose to wash your hair and body, wash as usual with your normal shampoo and body-wash/soap.  3. Rinse your hair and body thoroughly to remove the shampoo and soap.  4. Apply CHG directly to the skin (ONLY FROM THE NECK DOWN) and wash gently with a scrungie or a clean washcloth.   5. Do not use on open wounds or open sores. Avoid contact with your eyes, ears, mouth and genitals (private parts). Wash Face and genitals (private  parts)  with your normal soap.   6. Wash thoroughly, paying special attention to the area where your surgery will be performed.  7. Thoroughly rinse your body with warm water from the neck down.  8. DO NOT shower/wash with your normal soap after using and rinsing off the CHG Soap.  9. Pat yourself dry with a CLEAN TOWEL.  10. Wear CLEAN PAJAMAS to bed the night before surgery  11. Place CLEAN SHEETS on your bed the night of your first shower and DO NOT SLEEP WITH PETS.  12. Wear comfortable clothes the morning of surgery.      Day of Surgery:  Please shower the morning of surgery with the CHG soap Do not apply any deodorants/lotions. Please wear clean clothes to the hospital/surgery center.   Remember to brush your teeth WITH YOUR REGULAR TOOTHPASTE.   Please read over the following fact sheets that you were given.

## 2019-09-19 ENCOUNTER — Ambulatory Visit: Payer: Self-pay | Admitting: Orthopedic Surgery

## 2019-09-19 ENCOUNTER — Other Ambulatory Visit: Payer: Self-pay

## 2019-09-19 ENCOUNTER — Encounter (HOSPITAL_COMMUNITY): Payer: Self-pay

## 2019-09-19 ENCOUNTER — Ambulatory Visit (HOSPITAL_COMMUNITY)
Admission: RE | Admit: 2019-09-19 | Discharge: 2019-09-19 | Disposition: A | Discharge status: Home / Self Care | Payer: Medicare Other | Source: Ambulatory Visit | Attending: Orthopedic Surgery | Admitting: Orthopedic Surgery

## 2019-09-19 ENCOUNTER — Encounter (HOSPITAL_COMMUNITY)
Admission: RE | Admit: 2019-09-19 | Discharge: 2019-09-19 | Disposition: A | Discharge status: Home / Self Care | Payer: Medicare Other | Source: Ambulatory Visit | Attending: Orthopedic Surgery | Admitting: Orthopedic Surgery

## 2019-09-19 DIAGNOSIS — Z79899 Other long term (current) drug therapy: Secondary | ICD-10-CM | POA: Diagnosis not present

## 2019-09-19 DIAGNOSIS — Z79890 Hormone replacement therapy (postmenopausal): Secondary | ICD-10-CM | POA: Insufficient documentation

## 2019-09-19 DIAGNOSIS — I10 Essential (primary) hypertension: Secondary | ICD-10-CM | POA: Insufficient documentation

## 2019-09-19 DIAGNOSIS — Z87891 Personal history of nicotine dependence: Secondary | ICD-10-CM | POA: Diagnosis not present

## 2019-09-19 DIAGNOSIS — E039 Hypothyroidism, unspecified: Secondary | ICD-10-CM | POA: Diagnosis not present

## 2019-09-19 DIAGNOSIS — S32019A Unspecified fracture of first lumbar vertebra, initial encounter for closed fracture: Secondary | ICD-10-CM | POA: Diagnosis not present

## 2019-09-19 DIAGNOSIS — J449 Chronic obstructive pulmonary disease, unspecified: Secondary | ICD-10-CM | POA: Diagnosis not present

## 2019-09-19 DIAGNOSIS — X58XXXA Exposure to other specified factors, initial encounter: Secondary | ICD-10-CM | POA: Insufficient documentation

## 2019-09-19 DIAGNOSIS — Z01818 Encounter for other preprocedural examination: Secondary | ICD-10-CM | POA: Diagnosis present

## 2019-09-19 DIAGNOSIS — Z7951 Long term (current) use of inhaled steroids: Secondary | ICD-10-CM | POA: Diagnosis not present

## 2019-09-19 HISTORY — DX: Essential (primary) hypertension: I10

## 2019-09-19 HISTORY — DX: Hypothyroidism, unspecified: E03.9

## 2019-09-19 HISTORY — DX: Pneumonia, unspecified organism: J18.9

## 2019-09-19 LAB — URINALYSIS, ROUTINE W REFLEX MICROSCOPIC
Bilirubin Urine: NEGATIVE
Glucose, UA: NEGATIVE mg/dL
Hgb urine dipstick: NEGATIVE
Ketones, ur: NEGATIVE mg/dL
Nitrite: NEGATIVE
Protein, ur: NEGATIVE mg/dL
Specific Gravity, Urine: 1.009 (ref 1.005–1.030)
WBC, UA: >50 WBC/hpf — ABNORMAL HIGH (ref 0–5)
pH: 6 (ref 5.0–8.0)

## 2019-09-19 LAB — BASIC METABOLIC PANEL
Anion gap: 11 (ref 5–15)
BUN: 17 mg/dL (ref 8–23)
CO2: 29 mmol/L (ref 22–32)
Calcium: 9.7 mg/dL (ref 8.9–10.3)
Chloride: 100 mmol/L (ref 98–111)
Creatinine, Ser: 0.7 mg/dL (ref 0.44–1.00)
GFR calc Af Amer: >60 mL/min (ref >60)
GFR calc non Af Amer: >60 mL/min (ref >60)
Glucose, Bld: 108 mg/dL — ABNORMAL HIGH (ref 70–99)
Potassium: 3.8 mmol/L (ref 3.5–5.1)
Sodium: 140 mmol/L (ref 135–145)

## 2019-09-19 LAB — CBC
HCT: 46.7 % — ABNORMAL HIGH (ref 36.0–46.0)
Hemoglobin: 14.5 g/dL (ref 12.0–15.0)
MCH: 29.5 pg (ref 26.0–34.0)
MCHC: 31 g/dL (ref 30.0–36.0)
MCV: 95.1 fL (ref 80.0–100.0)
Platelets: 425 10*3/uL — ABNORMAL HIGH (ref 150–400)
RBC: 4.91 MIL/uL (ref 3.87–5.11)
RDW: 13.8 % (ref 11.5–15.5)
WBC: 10.6 10*3/uL — ABNORMAL HIGH (ref 4.0–10.5)
nRBC: 0 % (ref 0.0–0.2)

## 2019-09-19 LAB — PROTIME-INR
INR: 1 (ref 0.8–1.2)
Prothrombin Time: 12.3 seconds (ref 11.4–15.2)

## 2019-09-19 LAB — APTT: aPTT: 29 seconds (ref 24–36)

## 2019-09-19 LAB — SURGICAL PCR SCREEN
MRSA, PCR: NEGATIVE
Staphylococcus aureus: NEGATIVE

## 2019-09-19 NOTE — H&P (Signed)
Subjective:   Allison Zuniga is a very pleasant 79 year old with underlying COPD as well as ankylosing spondylitis. She has had chronic issues with her back and SI joint but over the last 2-3 months she is gotten significantly worse. Although she has no focal neurological deficits imaging studies demonstrate an acute L1 compression deformity and chronic compression fractures L2-4. Because it is likely that the patient's acute L1 compression deformity is causing the increase of her pain, she has elected to move forward with surgical intervention.  She is scheduled for L1 Kyphoplasty 09/24/19 With Dr. Rolena Infante at [facility].  Patient Active Problem List   Diagnosis Date Noted  . Abnormal echocardiogram 03/14/2019  . 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 Essential hypertension 03/14/2019  . SOB (shortness of breath) 01/24/2019   Past Medical History:  Diagnosis Date  . COPD (chronic obstructive pulmonary disease) ([place])   . Former smoker   . Hypertension   . Hypothyroidism   . Pneumonia    > 10 years ago per pt     Past Surgical History:  Procedure Laterality Date  . EYE SURGERY     bilateral cataract removal 2021 per pt     Current Outpatient Medications  Medication Sig Dispense Refill Last Dose  . acetaminophen (TYLENOL) 500 MG tablet Take 1,000 mg by mouth every 6 (six) hours as needed (for pain.).     Marland Kitchen albuterol (VENTOLIN HFA) 108 (90 Base) MCG/ACT inhaler Inhale into the lungs every 6 (six) hours as needed for wheezing or shortness of breath.     . Ascorbic Acid (VITAMIN C) 1000 MG tablet Take 1,000 mg by mouth daily as needed (immune health.).      Marland Kitchen atenolol (TENORMIN) 50 MG tablet Take 50 mg by mouth 2 (two) times daily.      . Budeson-Glycopyrrol-Formoterol (BREZTRI AEROSPHERE) 160-9-4.8 MCG/ACT AERO Inhale 2 puffs into the lungs 2 (two) times daily. (Patient not taking: Reported on 09/11/2019) 1 g 1   . Cholecalciferol (VITAMIN D3) 1.25 MG (50000 UT) CAPS Take  50,000 Units by mouth every Wednesday.      . Cyanocobalamin 1000 MCG SUBL Take 1,000 mcg by mouth at bedtime.      . diclofenac Sodium (VOLTAREN) 1 % GEL Apply 1 application topically 2 (two) times daily as needed (pain.).     Marland Kitchen folic acid (FOLVITE) 1 MG tablet Take 1 mg by mouth daily.     . irbesartan (AVAPRO) 150 MG tablet Take 150 mg by mouth daily.     Marland Kitchen levothyroxine (SYNTHROID) 75 MCG tablet Take 75 mcg by mouth daily before breakfast.     . meloxicam (MOBIC) 7.5 MG tablet Take 7.5 mg by mouth daily.     . methotrexate 2.5 MG tablet Take 15 mg by mouth every Monday.     Vladimir Faster Glycol-Propyl Glycol 0.4-0.3 % SOLN Place 1 drop into both eyes 3 (three) times daily as needed (dry/irritated eyes.).     Marland Kitchen predniSONE (DELTASONE) 5 MG tablet Take 5 mg by mouth daily.     Marland Kitchen PREVIDENT 1.1 % GEL dental gel Place 1 application onto teeth in the morning and at bedtime.     Marland Kitchen tiotropium (SPIRIVA) 18 MCG inhalation capsule Place 18 mcg into inhaler and inhale daily.     . TURMERIC CURCUMIN PO Take 750 mg by mouth daily in the afternoon.      No current facility-administered medications for this visit.   Allergies  Allergen Reactions  . Aspirin  Hives  . Penicillins Hives  . Sulfa Antibiotics Other (See Comments)    Chest tightness    Social History   Tobacco Use  . Smoking status: Former Smoker    Packs/day: 1.00    Years: 30.00    Pack years: 30.00    Quit date: 2015    Years since quitting: 6.3  . Smokeless tobacco: Former Network engineer Use Topics  . Alcohol use: Never    Family History  Problem Relation Age of Onset  . Asthma Mother     Review of Systems As stated in HPI  Objective:   Vitals: Ht: 5 ft 2 in 09/19/2019 01:20 pm Wt: 145 lbs 09/19/2019 01:20 pm BMI: 26.5 09/19/2019 01:20 pm BP: 148/77 09/19/2019 01:23 pm Pulse: 85 bpm 09/19/2019 01:23 pm T: 98.4 F 09/19/2019 01:23 pm Pain Scale: 8 09/19/2019 01:20 pm  Clinical exam: Searra is a pleasant  individual, who appears younger than their stated age. She is alert and orientated 3. No shortness of breath, chest pain.  Heart: Regular rate and rhythm, no rubs, murmurs, or gallops  Lungs: Clear to auscultation bilaterally  Abdomen is soft and non-tender, negative loss of bowel and bladder control, no rebound tenderness. Bowel sounds 4Negative: skin lesions abrasions contusions  Peripheral pulses: 2+ dorsalis pedis/posterior tibialis pulses bilaterally. Compartment soft and nontender.  Gait pattern: Altered gait pattern due to back pain.  Assistive devices: Rolling walker  Neuro: 5/5 motor strength in the lower extremity bilaterally. Intermittent dysesthesias in the lower extremities bilaterally primarily in the thigh and buttock region. Negative Babinski test, no clonus, negative straight leg raise test.  Musculoskeletal: Significant mid lumbar pain with direct palpation. Pain is improved when she is supine. Positive SI joint pain right side worse than the left at present. Negative Faber test.   X-rays of the lumbar spine completed on 08/26/19 at [facility] were reviewed. I agree with the radiology report. Osteoporotic endplate compression fractures are noted throughout the lumbar spine these. L1-L4. There is arthritic changes of the facet joint L4-S1.  Lumbar MRI: completed on 08/26/19 was reviewed with the patient. [facility] was completed at [facility]; I have independently reviewed the images as well as the radiology report. Positive edema in the inferior half of the L1 vertebral body indicating a more recent 2025% compression fracture no evidence of retropulsion. Multiple old compression deformities are noted consistent with osteoporosis L2-L4. Minimal bony retropulsion at L2 but no significant stenosis. Positive facet arthrosis L4-5 creating minimal canal and lateral recess stenosis. No significant disc herniation worse stenosis noted.  Assessment:    Sebrena is a very pleasant 79 year old with underlying COPD as well as ankylosing spondylitis. She has had chronic issues with her back and SI joint but over the last 2-3 months she is gotten significantly worse. Although she has no focal neurological deficits imaging studies demonstrate an acute L1 compression deformity and chronic compression fractures L2-4.  Plan:    To address the lumbar pain I recommended a kyphoplasty. We have gone over the surgical procedure in great detail and her and her husband's questions were addressed.Risks of surgery include: Infection, bleeding, death, stroke, paralysis, nerve damage, leak of cement, need for additional surgery including open decompression. Ongoing or worse pain.    After she recovers from surgery I would recommend aquatic physical therapy to help rebuild her lower extremity strength as well as her stamina.   Goals of surgery: Reduction in pain, and improvement in quality of  life   I reviewed the patient's medication list with her. She is not on any blood thinners or aspirin. She was previously on Mobix which I have advised her to discontinue prior to surgery. I have also advised her to avoid any over-the-counter anti-inflammatory medications for pain. I will also have her hold her vitamins and supplements as well as her methotrexate leading up to surgery. She can continue with her blood pressure medication And thyroid medication. I have also sent her in [place] for a UTI that was noted on preoperative testing. She can take Tylenol and topical lidocaine patches for pain. We will restart her vitamins, supplements, methotrexate in the postoperative period.   We will obtain preoperative clearance from the patient's primary care provider.   We have also discussed the post-operative recovery period to include: bathing/showering restrictions, wound healing, activity (and driving) restrictions, medications/pain mangement.   We have also discussed  post-operative redflags to include: signs and symptoms of postoperative infection, DVT/PE.   All patients questions were invited and answered.   Follow-up: 2 weeks postoperatively

## 2019-09-19 NOTE — Progress Notes (Signed)
PCP Carlis Abbott, MD (in [place]) Cardiologist - Harrell Gave, MD Pulmonologist - Valeta Harms, MD [place], MD  Chest x-ray - 09/19/19 EKG - 03/14/19 Stress Test - pt denies ECHO - 01/2019  Cardiac Cath - pt denies   Blood Thinner Instructions: n/a Aspirin Instructions: n/a    COVID TEST- day of surgery Jeannene Patella, RN aware  Coronavirus Screening  Have you experienced the following symptoms:  Cough yes/no: No Fever (>100.27F)  yes/no: No Runny nose yes/no: No Sore throat yes/no: No Difficulty breathing/shortness of breath  yes/no: No  Have you or a family member traveled in the last 14 days and where? yes/no: No   If the patient indicates "YES" to the above questions, their PAT will be rescheduled to limit the exposure to others and, the surgeon will be notified. THE PATIENT WILL NEED TO BE ASYMPTOMATIC FOR 14 DAYS.   If the patient is not experiencing any of these symptoms, the PAT nurse will instruct them to NOT bring anyone with them to their appointment since they may have these symptoms or traveled as well.   Please remind your patients and families that hospital visitation restrictions are in effect and the importance of the restrictions.     Anesthesia review: yes, COPD   Patient denies shortness of breath, fever, cough and chest pain at PAT appointment   All instructions explained to the patient, with a verbal understanding of the material. Patient agrees to go over the instructions while at home for a better understanding. Patient also instructed to self quarantine after being tested for COVID-19. The opportunity to ask questions was provided.

## 2019-09-22 NOTE — Anesthesia Preprocedure Evaluation (Addendum)
Anesthesia Evaluation  Patient identified by MRN, date of birth, ID band Patient awake    Reviewed: Allergy & Precautions, NPO status , Patient's Chart, lab work & pertinent test results, reviewed documented beta blocker date and time   History of Anesthesia Complications Negative for: history of anesthetic complications  Airway Mallampati: II  TM Distance: >3 FB Neck ROM: Full    Dental   Pulmonary COPD, former smoker,    Pulmonary exam normal        Cardiovascular hypertension, Pt. on medications and Pt. on home beta blockers Normal cardiovascular exam     Neuro/Psych negative neurological ROS  negative psych ROS   GI/Hepatic negative GI ROS, Neg liver ROS,   Endo/Other  Hypothyroidism   Renal/GU negative Renal ROS  negative genitourinary   Musculoskeletal Ankylosing spondylitis   Abdominal   Peds  Hematology negative hematology ROS (+)   Anesthesia Other Findings  Echo 01/14/19 [facility]): Normal left ventricular size.  Mildly decreased LV systolic function with EF estimated 50%.  Normal LV wall thickness.  Mild aortic insufficiency.  Reproductive/Obstetrics                          Anesthesia Physical Anesthesia Plan  ASA: II  Anesthesia Plan: MAC   Post-op Pain Management:    Induction: Intravenous  PONV Risk Score and Plan: 2 and Propofol infusion, TIVA and Treatment may vary due to age or medical condition  Airway Management Planned: Natural Airway, Nasal Cannula and Simple Face Mask  Additional Equipment: None  Intra-op Plan:   Post-operative Plan:   Informed Consent: I have reviewed the patients History and Physical, chart, labs and discussed the procedure including the risks, benefits and alternatives for the proposed anesthesia with the patient or authorized representative who has indicated his/her understanding and acceptance.       Plan Discussed with:    Anesthesia Plan Comments: (PAT note written 09/22/2019 by Myra Gianotti, PA-C. )       Anesthesia Quick Evaluation

## 2019-09-22 NOTE — Progress Notes (Signed)
Anesthesia Chart Review:  Case: [id number] Date/Time: 09/24/19 1400   Procedure: L1 KYPHOPLASTY (N/A ) - 60 ins   Anesthesia type: General   Pre-op diagnosis: L1 Compression fracture   Location: [facility] OR ROOM 04 / [facility]   Surgeons: Melina Schools, MD      DISCUSSION: Patient is a 79 year old female scheduled for the above procedure.  History includes former smoker (quit 05/15/13), COPD, HTN, hypothyroidism. She is followed by rheumatology for joint pain and swelling with + ANA, ankylosing spondylitis.  She had evaluation by cardiology and pulmonology approximately six months ago (see below). No additional cardiac testing ordered and follow-up chest CT planned for ~ 6 months (due ~ May/June 2021).   PCP Mosetta Anis, MD signed a letter of medical clearance for surgery.   Labs reviewed by surgical team and reported patient started on antibiotic for suspected UTI. She lives in [place], [place], so day of surgery COVID-19 testing planned. Anesthesia team to evaluate on arrival. Med list includes prednisone 5 mg daily.    VS: BP (!) 154/77   Pulse 82   Temp 36.7 C (Oral)   Resp 18   Ht 5\' 2"  (1.575 m)   Wt 65 kg   SpO2 97%   BMI 26.21 kg/m    PROVIDERS: Mosetta Anis, MD is PCP  - June Leap, DO is pulmonologist. By 04/02/19 notation, her HRCT was stable and six month follow-up planned. Buford Dresser, MD is cardiologist. Referred by pulmonology for dyspnea and review 2020 echo results (LVEF 50%, mild AI). No chest pain, euvolemic on exam. EKG unremarkable. One year follow-up recommended.  Gavin Pound, MD is rheumatologist. 05/29/19 office note scanned under Media tab.   LABS: Preoperative labs noted. See DISCUSSION. (all labs ordered are listed, but only abnormal results are displayed)  Labs Reviewed  BASIC METABOLIC PANEL - Abnormal; Notable for the following components:      Result Value   Glucose, Bld 108 (*)    All other components within normal limits   CBC - Abnormal; Notable for the following components:   WBC 10.6 (*)    HCT 46.7 (*)    Platelets 425 (*)    All other components within normal limits  URINALYSIS, ROUTINE W REFLEX MICROSCOPIC - Abnormal; Notable for the following components:   APPearance HAZY (*)    Leukocytes,Ua LARGE (*)    WBC, UA >50 (*)    Bacteria, UA FEW (*)    All other components within normal limits  SURGICAL PCR SCREEN  APTT  PROTIME-INR     IMAGES: CXR 09/19/19: FINDINGS: No acute consolidation or effusion. Bilateral perihilar bronchiectasis. Probable scarring at the right base. Normal heart size. No pneumothorax. Moderate compression deformity at the lower thoracic spine. IMPRESSION: No active cardiopulmonary disease. Bronchiectasis and probable scarring at the right base. Moderate compression deformity at the lower thoracic spine  CT Chest (high resolution) 04/01/19: IMPRESSION: 1. 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 2. Several scattered solid pulmonary nodules in the lungs bilaterally, largest with average diameter 8 mm in the right lower lobe and 8 mm in the left upper lobe. Accurate comparison to the 01/10/2019 chest CT study cannot be made unless the outside images are submitted for direct comparison. Given the presence of bronchiectasis and tree-in-bud opacities, these nonspecific  nodules may be inflammatory. If the outside chest CT images are submitted for direct comparison, an addendum will be made at that time. If the outside chest CT images cannot be obtained, non-contrast chest CT at 3-6 months is recommended. If the nodules are stable at time of repeat CT, then future CT at 18-24 months (from today's scan) is considered optional  for low-risk patients, but is recommended for high-risk patients. This recommendation follows the consensus statement: Guidelines for Management of Incidental Pulmonary Nodules Detected on CT Images: From the Fleischner Society 2017; Radiology 2017; 284:228-243. 3. Three-vessel coronary atherosclerosis.   EKG: 03/14/19: Normal sinus rhythm Possible left atrial enlargement Borderline ECG   CV: Echo 01/14/19 [facility]): Normal left ventricular size.  Mildly decreased LV systolic function with EF estimated 50%.  Normal LV wall thickness.  Mild aortic insufficiency.   Past Medical History:  Diagnosis Date  . COPD (chronic obstructive pulmonary disease) ([place])   . Former smoker   . Hypertension   . Hypothyroidism   . Pneumonia    > 10 years ago per pt     Past Surgical History:  Procedure Laterality Date  . EYE SURGERY     bilateral cataract removal 2021 per pt     MEDICATIONS: . acetaminophen (TYLENOL) 500 MG tablet  . albuterol (VENTOLIN HFA) 108 (90 Base) MCG/ACT inhaler  . Ascorbic Acid (VITAMIN C) 1000 MG tablet  . atenolol (TENORMIN) 50 MG tablet  . Budeson-Glycopyrrol-Formoterol (BREZTRI AEROSPHERE) 160-9-4.8 MCG/ACT AERO  . Cholecalciferol (VITAMIN D3) 1.25 MG (50000 UT) CAPS  . Cyanocobalamin 1000 MCG SUBL  . diclofenac Sodium (VOLTAREN) 1 % GEL  . folic acid (FOLVITE) 1 MG tablet  . irbesartan (AVAPRO) 150 MG tablet  . levothyroxine (SYNTHROID) 75 MCG tablet  . meloxicam (MOBIC) 7.5 MG tablet  . methotrexate 2.5 MG tablet  . Polyethyl Glycol-Propyl Glycol 0.4-0.3 % SOLN  . predniSONE (DELTASONE) 5 MG tablet  . PREVIDENT 1.1 % GEL dental gel  . tiotropium (SPIRIVA) 18 MCG inhalation capsule  . TURMERIC CURCUMIN PO   No current facility-administered medications for this encounter.     Myra Gianotti, PA-C Surgical Short Stay/Anesthesiology [facility] Phone [phone] [facility] Phone [phone] 09/22/2019 2:51 PM

## 2019-09-23 NOTE — Progress Notes (Signed)
Pt called with a question about her medications to take in the morning for surgery. She was wondering if she was supposed to take her Irbesartan in the morning, she said it was not listed on the medications to take day of surgery in her instructions. I explained to her that we do NOT want her to take medication the morning of surgery because of possible reactions with anesthesia. She voiced understanding.

## 2019-09-24 ENCOUNTER — Observation Stay (HOSPITAL_COMMUNITY)
Admission: RE | Admit: 2019-09-24 | Discharge: 2019-09-25 | Disposition: A | Discharge status: Home / Self Care | Payer: Medicare Other | Attending: Orthopedic Surgery | Admitting: Orthopedic Surgery

## 2019-09-24 ENCOUNTER — Ambulatory Visit (HOSPITAL_COMMUNITY): Payer: Medicare Other | Admitting: Certified Registered Nurse Anesthetist

## 2019-09-24 ENCOUNTER — Ambulatory Visit (HOSPITAL_COMMUNITY): Payer: Medicare Other | Admitting: Physician Assistant

## 2019-09-24 ENCOUNTER — Other Ambulatory Visit: Payer: Self-pay

## 2019-09-24 ENCOUNTER — Encounter (HOSPITAL_COMMUNITY)
Admission: RE | Disposition: A | Discharge status: Home / Self Care | Payer: Self-pay | Source: Home / Self Care | Attending: Orthopedic Surgery

## 2019-09-24 ENCOUNTER — Ambulatory Visit (HOSPITAL_COMMUNITY): Payer: Medicare Other

## 2019-09-24 ENCOUNTER — Encounter (HOSPITAL_COMMUNITY): Payer: Self-pay | Admitting: Orthopedic Surgery

## 2019-09-24 DIAGNOSIS — Z880 Allergy status to penicillin: Secondary | ICD-10-CM | POA: Diagnosis not present

## 2019-09-24 DIAGNOSIS — Z791 Long term (current) use of non-steroidal anti-inflammatories (NSAID): Secondary | ICD-10-CM | POA: Diagnosis not present

## 2019-09-24 DIAGNOSIS — Z79899 Other long term (current) drug therapy: Secondary | ICD-10-CM | POA: Insufficient documentation

## 2019-09-24 DIAGNOSIS — Z7952 Long term (current) use of systemic steroids: Secondary | ICD-10-CM | POA: Insufficient documentation

## 2019-09-24 DIAGNOSIS — Z7982 Long term (current) use of aspirin: Secondary | ICD-10-CM | POA: Insufficient documentation

## 2019-09-24 DIAGNOSIS — Z882 Allergy status to sulfonamides status: Secondary | ICD-10-CM | POA: Insufficient documentation

## 2019-09-24 DIAGNOSIS — Z20822 Contact with and (suspected) exposure to covid-19: Secondary | ICD-10-CM | POA: Insufficient documentation

## 2019-09-24 DIAGNOSIS — S32000A Wedge compression fracture of unspecified lumbar vertebra, initial encounter for closed fracture: Secondary | ICD-10-CM | POA: Diagnosis present

## 2019-09-24 DIAGNOSIS — I10 Essential (primary) hypertension: Secondary | ICD-10-CM | POA: Insufficient documentation

## 2019-09-24 DIAGNOSIS — Z79890 Hormone replacement therapy (postmenopausal): Secondary | ICD-10-CM | POA: Diagnosis not present

## 2019-09-24 DIAGNOSIS — M8088XA Other osteoporosis with current pathological fracture, vertebra(e), initial encounter for fracture: Secondary | ICD-10-CM | POA: Diagnosis present

## 2019-09-24 DIAGNOSIS — E039 Hypothyroidism, unspecified: Secondary | ICD-10-CM | POA: Diagnosis not present

## 2019-09-24 DIAGNOSIS — J449 Chronic obstructive pulmonary disease, unspecified: Secondary | ICD-10-CM | POA: Insufficient documentation

## 2019-09-24 DIAGNOSIS — Z7951 Long term (current) use of inhaled steroids: Secondary | ICD-10-CM | POA: Insufficient documentation

## 2019-09-24 DIAGNOSIS — Z419 Encounter for procedure for purposes other than remedying health state, unspecified: Secondary | ICD-10-CM

## 2019-09-24 DIAGNOSIS — Z87891 Personal history of nicotine dependence: Secondary | ICD-10-CM | POA: Insufficient documentation

## 2019-09-24 HISTORY — PX: KYPHOPLASTY: SHX5884

## 2019-09-24 LAB — SARS CORONAVIRUS 2 BY RT PCR (HOSPITAL ORDER, PERFORMED IN ~~LOC~~ HOSPITAL LAB): SARS Coronavirus 2: NEGATIVE

## 2019-09-24 SURGERY — KYPHOPLASTY
Anesthesia: Monitor Anesthesia Care | Site: Spine Lumbar

## 2019-09-24 MED ORDER — PHENYLEPHRINE 40 MCG/ML (10ML) SYRINGE FOR IV PUSH (FOR BLOOD PRESSURE SUPPORT)
PREFILLED_SYRINGE | INTRAVENOUS | Status: AC
  Filled 2019-09-24: qty 10

## 2019-09-24 MED ORDER — BUPIVACAINE LIPOSOME 1.3 % IJ SUSP
20.0000 mL | INTRAMUSCULAR | Status: DC
  Filled 2019-09-24: qty 20

## 2019-09-24 MED ORDER — ONDANSETRON HCL 4 MG/2ML IJ SOLN
INTRAMUSCULAR | Status: DC | PRN
Start: 2019-09-24 — End: 2019-09-24
  Administered 2019-09-24: 4 mg via INTRAVENOUS

## 2019-09-24 MED ORDER — OXYCODONE HCL 5 MG/5ML PO SOLN: 5.0000 mg | Freq: Once | ORAL | Status: AC | PRN

## 2019-09-24 MED ORDER — OXYCODONE HCL 5 MG PO TABS
5.0000 mg | ORAL_TABLET | Freq: Once | ORAL | Status: AC | PRN
  Administered 2019-09-24: 19:00:00 5 mg via ORAL

## 2019-09-24 MED ORDER — BUPIVACAINE LIPOSOME 1.3 % IJ SUSP
INTRAMUSCULAR | Status: DC | PRN
  Administered 2019-09-24: 20 mL

## 2019-09-24 MED ORDER — LACTATED RINGERS IV SOLN: INTRAVENOUS | Status: DC

## 2019-09-24 MED ORDER — ATENOLOL 50 MG PO TABS
50.0000 mg | ORAL_TABLET | Freq: Two times a day (BID) | ORAL | Status: DC
  Administered 2019-09-24: 50 mg via ORAL
  Filled 2019-09-24 (×2): qty 1

## 2019-09-24 MED ORDER — FENTANYL CITRATE (PF) 100 MCG/2ML IJ SOLN
INTRAMUSCULAR | Status: AC
  Filled 2019-09-24: qty 2

## 2019-09-24 MED ORDER — IOPAMIDOL (ISOVUE-300) INJECTION 61%
INTRAVENOUS | Status: DC | PRN
  Administered 2019-09-24 (×2): 50 mL

## 2019-09-24 MED ORDER — OXYCODONE HCL 5 MG PO TABS
5.0000 mg | ORAL_TABLET | Freq: Four times a day (QID) | ORAL | Status: DC | PRN
  Administered 2019-09-24 – 2019-09-25 (×2): 5 mg via ORAL
  Filled 2019-09-24 (×2): qty 1

## 2019-09-24 MED ORDER — PROPOFOL 10 MG/ML IV BOLUS
INTRAVENOUS | Status: AC
  Filled 2019-09-24: qty 20

## 2019-09-24 MED ORDER — LEVOTHYROXINE SODIUM 75 MCG PO TABS
75.0000 ug | ORAL_TABLET | Freq: Every day | ORAL | Status: DC
  Administered 2019-09-25: 75 ug via ORAL
  Filled 2019-09-24: qty 1

## 2019-09-24 MED ORDER — FOLIC ACID 1 MG PO TABS
1.0000 mg | ORAL_TABLET | Freq: Every day | ORAL | Status: DC
  Filled 2019-09-24: qty 1

## 2019-09-24 MED ORDER — FENTANYL CITRATE (PF) 250 MCG/5ML IJ SOLN
INTRAMUSCULAR | Status: AC
  Filled 2019-09-24: qty 5

## 2019-09-24 MED ORDER — ONDANSETRON HCL 4 MG/2ML IJ SOLN: 4.0000 mg | Freq: Four times a day (QID) | INTRAMUSCULAR | Status: DC | PRN

## 2019-09-24 MED ORDER — TRAMADOL HCL 50 MG PO TABS: 50.0000 mg | ORAL_TABLET | Freq: Four times a day (QID) | ORAL | 0 refills | Status: AC | PRN

## 2019-09-24 MED ORDER — ACETAMINOPHEN 325 MG PO TABS
650.0000 mg | ORAL_TABLET | ORAL | Status: DC | PRN
  Administered 2019-09-24 – 2019-09-25 (×2): 650 mg via ORAL
  Filled 2019-09-24 (×2): qty 2

## 2019-09-24 MED ORDER — BUPIVACAINE-EPINEPHRINE 0.5% -1:200000 IJ SOLN
INTRAMUSCULAR | Status: DC | PRN
  Administered 2019-09-24: 20 mL

## 2019-09-24 MED ORDER — PREDNISONE 5 MG PO TABS
5.0000 mg | ORAL_TABLET | Freq: Every day | ORAL | Status: DC
  Filled 2019-09-24: qty 1

## 2019-09-24 MED ORDER — ALBUTEROL SULFATE (2.5 MG/3ML) 0.083% IN NEBU: 2.5000 mg | INHALATION_SOLUTION | Freq: Four times a day (QID) | RESPIRATORY_TRACT | Status: DC | PRN

## 2019-09-24 MED ORDER — DIPHENHYDRAMINE HCL 50 MG/ML IJ SOLN
12.5000 mg | Freq: Once | INTRAMUSCULAR | Status: AC
  Administered 2019-09-24: 14:00:00 12.5 mg via INTRAVENOUS

## 2019-09-24 MED ORDER — SODIUM CHLORIDE 0.9 % IV SOLN: 250.0000 mL | INTRAVENOUS | Status: DC

## 2019-09-24 MED ORDER — ONDANSETRON HCL 4 MG PO TABS: 4.0000 mg | ORAL_TABLET | Freq: Four times a day (QID) | ORAL | Status: DC | PRN

## 2019-09-24 MED ORDER — DIPHENHYDRAMINE HCL 50 MG/ML IJ SOLN
INTRAMUSCULAR | Status: AC
  Filled 2019-09-24: qty 1

## 2019-09-24 MED ORDER — UMECLIDINIUM BROMIDE 62.5 MCG/INH IN AEPB
1.0000 | INHALATION_SPRAY | Freq: Every day | RESPIRATORY_TRACT | Status: DC
  Filled 2019-09-24: qty 7

## 2019-09-24 MED ORDER — ACETAMINOPHEN 650 MG RE SUPP: 650.0000 mg | RECTAL | Status: DC | PRN

## 2019-09-24 MED ORDER — BUPIVACAINE-EPINEPHRINE 0.5% -1:200000 IJ SOLN
INTRAMUSCULAR | Status: AC
  Filled 2019-09-24: qty 1

## 2019-09-24 MED ORDER — ONDANSETRON HCL 4 MG/2ML IJ SOLN: 4.0000 mg | Freq: Once | INTRAMUSCULAR | Status: DC | PRN

## 2019-09-24 MED ORDER — PROPOFOL 500 MG/50ML IV EMUL
INTRAVENOUS | Status: DC | PRN
  Administered 2019-09-24: 25 ug/kg/min via INTRAVENOUS

## 2019-09-24 MED ORDER — FENTANYL CITRATE (PF) 100 MCG/2ML IJ SOLN
INTRAMUSCULAR | Status: DC | PRN
  Administered 2019-09-24: 5 ug via INTRAVENOUS
  Administered 2019-09-24: 10 ug via INTRAVENOUS
  Administered 2019-09-24 (×3): 5 ug via INTRAVENOUS
  Administered 2019-09-24: 10 ug via INTRAVENOUS
  Administered 2019-09-24: 5 ug via INTRAVENOUS
  Administered 2019-09-24: 25 ug via INTRAVENOUS

## 2019-09-24 MED ORDER — PHENYLEPHRINE 40 MCG/ML (10ML) SYRINGE FOR IV PUSH (FOR BLOOD PRESSURE SUPPORT)
PREFILLED_SYRINGE | INTRAVENOUS | Status: DC | PRN
  Administered 2019-09-24 (×2): 40 ug via INTRAVENOUS
  Administered 2019-09-24: 80 ug via INTRAVENOUS

## 2019-09-24 MED ORDER — SODIUM CHLORIDE 0.9% FLUSH: 3.0000 mL | INTRAVENOUS | Status: DC | PRN

## 2019-09-24 MED ORDER — OXYCODONE HCL 5 MG PO TABS
ORAL_TABLET | ORAL | Status: AC
  Filled 2019-09-24: qty 1

## 2019-09-24 MED ORDER — SODIUM CHLORIDE 0.9% FLUSH: 3.0000 mL | Freq: Two times a day (BID) | INTRAVENOUS | Status: DC

## 2019-09-24 MED ORDER — TIOTROPIUM BROMIDE MONOHYDRATE 18 MCG IN CAPS: 18.0000 ug | ORAL_CAPSULE | Freq: Every day | RESPIRATORY_TRACT | Status: DC

## 2019-09-24 MED ORDER — 0.9 % SODIUM CHLORIDE (POUR BTL) OPTIME
TOPICAL | Status: DC | PRN
  Administered 2019-09-24: 1000 mL

## 2019-09-24 MED ORDER — FENTANYL CITRATE (PF) 100 MCG/2ML IJ SOLN
25.0000 ug | INTRAMUSCULAR | Status: DC | PRN
  Administered 2019-09-24: 25 ug via INTRAVENOUS

## 2019-09-24 MED ORDER — METHOTREXATE 2.5 MG PO TABS: 15.0000 mg | ORAL_TABLET | ORAL | Status: DC

## 2019-09-24 MED ORDER — ONDANSETRON HCL 4 MG PO TABS: 4.0000 mg | ORAL_TABLET | Freq: Three times a day (TID) | ORAL | 0 refills | Status: AC | PRN

## 2019-09-24 MED ORDER — ONDANSETRON HCL 4 MG/2ML IJ SOLN
INTRAMUSCULAR | Status: AC
  Filled 2019-09-24: qty 2

## 2019-09-24 MED ORDER — VANCOMYCIN HCL IN DEXTROSE 1-5 GM/200ML-% IV SOLN
1000.0000 mg | INTRAVENOUS | Status: DC
  Filled 2019-09-24: qty 200

## 2019-09-24 SURGICAL SUPPLY — 42 items
BLADE SURG 15 STRL LF DISP TIS (BLADE) ×1 IMPLANT
BLADE SURG 15 STRL SS (BLADE) ×1
BNDG ADH 1X3 SHEER STRL LF (GAUZE/BANDAGES/DRESSINGS) ×4 IMPLANT
CEMENT KYPHON CX01A KIT/MIXER (Cement) ×2 IMPLANT
COVER MAYO STAND STRL (DRAPES) IMPLANT
COVER SURGICAL LIGHT HANDLE (MISCELLANEOUS) ×2 IMPLANT
COVER WAND RF STERILE (DRAPES) ×2 IMPLANT
CURETTE EXPRESS SZ2 7MM (INSTRUMENTS) ×1 IMPLANT
CURETTE WEDGE 8.5MM KYPHX (MISCELLANEOUS) IMPLANT
CURRETTE EXPRESS SZ2 7MM (INSTRUMENTS) ×2
DERMABOND ADHESIVE PROPEN (GAUZE/BANDAGES/DRESSINGS) ×1
DERMABOND ADVANCED (GAUZE/BANDAGES/DRESSINGS) ×1
DERMABOND ADVANCED .7 DNX12 (GAUZE/BANDAGES/DRESSINGS) ×1 IMPLANT
DERMABOND ADVANCED .7 DNX6 (GAUZE/BANDAGES/DRESSINGS) ×1 IMPLANT
DRAPE C-ARM 42X72 X-RAY (DRAPES) ×4 IMPLANT
DRAPE INCISE IOBAN 66X45 STRL (DRAPES) ×2 IMPLANT
DRAPE LAPAROTOMY T 102X78X121 (DRAPES) ×2 IMPLANT
DRAPE WARM FLUID 44X44 (DRAPES) ×2 IMPLANT
DURAPREP 26ML APPLICATOR (WOUND CARE) ×2 IMPLANT
GLOVE BIO SURGEON STRL SZ 6.5 (GLOVE) ×2 IMPLANT
GLOVE BIOGEL PI IND STRL 6.5 (GLOVE) ×1 IMPLANT
GLOVE BIOGEL PI IND STRL 8.5 (GLOVE) IMPLANT
GLOVE BIOGEL PI INDICATOR 6.5 (GLOVE) ×1
GLOVE BIOGEL PI INDICATOR 8.5 (GLOVE)
GLOVE SS BIOGEL STRL SZ 8.5 (GLOVE) ×1 IMPLANT
GLOVE SUPERSENSE BIOGEL SZ 8.5 (GLOVE) ×1
GOWN STRL REUS W/ TWL LRG LVL3 (GOWN DISPOSABLE) ×2 IMPLANT
GOWN STRL REUS W/TWL 2XL LVL3 (GOWN DISPOSABLE) ×2 IMPLANT
GOWN STRL REUS W/TWL LRG LVL3 (GOWN DISPOSABLE) ×2
KIT BASIN OR (CUSTOM PROCEDURE TRAY) ×2 IMPLANT
KIT TURNOVER KIT B (KITS) ×2 IMPLANT
NEEDLE HYPO 22GX1.5 SAFETY (NEEDLE) ×2 IMPLANT
NEEDLE SPNL 22GX3.5 QUINCKE BK (NEEDLE) ×2 IMPLANT
NS IRRIG 1000ML POUR BTL (IV SOLUTION) ×2 IMPLANT
PACK SURGICAL SETUP 50X90 (CUSTOM PROCEDURE TRAY) ×2 IMPLANT
PAD ARMBOARD 7.5X6 YLW CONV (MISCELLANEOUS) ×4 IMPLANT
SPONGE LAP 4X18 RFD (DISPOSABLE) ×2 IMPLANT
SUT MNCRL AB 3-0 PS2 18 (SUTURE) ×2 IMPLANT
SYR CONTROL 10ML LL (SYRINGE) ×2 IMPLANT
TOWEL GREEN STERILE (TOWEL DISPOSABLE) ×2 IMPLANT
TRAY KYPHOPAK 15/3 ONESTEP 1ST (MISCELLANEOUS) ×4 IMPLANT
WATER STERILE IRR 1000ML POUR (IV SOLUTION) ×2 IMPLANT

## 2019-09-24 NOTE — OR Nursing (Signed)
OBTAINED CONSENT FROM PATIENT'S DAUGHTER AFTER START OF CASE, AT 17:04 PM ON 09/24/2019, TO OPERATE ON THORACIC TWELVE FRACTURE.

## 2019-09-24 NOTE — Transfer of Care (Signed)
Immediate Anesthesia Transfer of Care Note  Patient: Allison Zuniga  Procedure(s) Performed: THORACIC TWELVE, LUMBAR ONE KYPHOPLASTY (N/A Spine Lumbar)  Patient Location: PACU  Anesthesia Type:MAC  Level of Consciousness: awake, alert  and oriented  Airway & Oxygen Therapy: Patient Spontanous Breathing and Patient connected to nasal cannula oxygen  Post-op Assessment: Report given to RN and Post -op Vital signs reviewed and stable  Post vital signs: Reviewed and stable  Last Vitals:  Vitals Value Taken Time  BP 116/65 09/24/19 1756  Temp    Pulse 73 09/24/19 1802  Resp 31 09/24/19 1802  SpO2 100 % 09/24/19 1802  Vitals shown include unvalidated device data.  Last Pain:  Vitals:   09/24/19 1205  PainSc: 8       Patients Stated Pain Goal: 3 (67/67/20 9470)  Complications: No apparent anesthesia complications

## 2019-09-24 NOTE — H&P (Signed)
Addendum H&P  Patient presents today with ongoing significant upper lumbar pain secondary to her L1 acute compression deformity.  Given her severe pain and the failure to improve with conservative management we have elected to move forward with a kyphoplasty.  I have again gone over the risks benefits and alternatives to surgery and all of her questions were encouraged and addressed.  There has been no change in her clinical exam since her last office visit of 09/19/2019.

## 2019-09-24 NOTE — Op Note (Signed)
Operative report  Preoperative diagnosis L1 osteoporotic compression fracture.  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  Operative procedure: Kyphoplasty of [id number] and L1  Complications: None  Condition: Stable  History: This is a very pleasant 79 year old woman who presented to my office with complaints of significant back pain.  MRI and x-rays confirmed multilevel lumbar compression fracture with L1 being acute/subacute.  There was evidence of edema consistent with an acute injury at L1.  Because of her sharp increase in pain we elected to move forward with kyphoplasty of L1.  I explained all the risks benefits and alternatives of surgery with the patient and consent was obtained.  Operative report: Patient was brought the operating room placed upon the operating room table.  After successful induction of IV sedation the back was prepped and draped in a standard fashion.  Timeout was taken to confirm patient procedure and all other important data.  Using bipolar fluoroscopy identified the L5 vertebral body.  I then counted up until I could see the L1 vertebral body is a patient I am I noted that the T12 vertebral body was also compressed.  This was dramatically different compared to the preoperative MRI.  This at this time I realized that the patient had an acute T12 as well as L1 compression fracture.  At this point I contacted her daughter who was in the waiting area and we obtained verbal consent to address both levels.  At this point identified the lateral borders of the T12 and L1 pedicles and marked them I infiltrated the area with quarter percent Marcaine with epinephrine and Exparel.  Once IV sedation and local anesthesia was achieved I then made small stab incisions and advanced the trochars down to the lateral aspect of the pedicles.  Using AP fluoroscopy I advanced the Jamshidi needles into the pedicle.  As I was nearing the medial wall of the pedicle I  confirmed in the lateral view that I was just beyond the posterior wall of the vertebral body.  Once I confirmed trajectory and position I advanced into the vertebral body.  Once all 4 pedicles were cannulated I then drilled then sounded the canal.  I confirmed that a solid bony canal I then used a curette to better prepare for the inflatable bone tamp.  The inflatable bone tamps were inserted and the T12 and L1 vertebral bodies were inflated.  Once the bone void was created I then began inserting bone into each level.  At L1 a total of about 5-1/2 cc of cement was placed and at T12 approximately 4 cc of cement was placed.  Both levels had excellent fill of the cement and there was no evidence of any leak.  Once the cement was allowed to harden the cannulas were removed and the skin was cleaned.  I then closed all 4 incisions with interrupted 3-0 Monocryl stitch.  I then placed skin glue and a Band-Aid and the patient was transferred to the PACU without incident.  The end of the case all needle sponge counts were correct.  Patient was hemodynamically and neurovascularly intact.

## 2019-09-24 NOTE — Brief Op Note (Signed)
09/24/2019  5:59 PM  PATIENT:  Allison Zuniga  79 y.o. female  PRE-OPERATIVE DIAGNOSIS:  L1 Compression fracture  POST-OPERATIVE DIAGNOSIS: T12, L1 Compression fracture  PROCEDURE:  Procedure(s) with comments: THORACIC TWELVE, LUMBAR ONE KYPHOPLASTY (N/A) - 60 ins  SURGEON:  Surgeon(s) and Role:    Melina Schools, MD - Primary  PHYSICIAN ASSISTANT:   ASSISTANTS: none   ANESTHESIA:   local and IV sedation  EBL:  10 mL   BLOOD ADMINISTERED:none  DRAINS: none   LOCAL MEDICATIONS USED:  MARCAINE    and OTHER exparel  SPECIMEN:  No Specimen  DISPOSITION OF SPECIMEN:  N/A  COUNTS:  YES  TOURNIQUET:  * No tourniquets in log *  DICTATION: .Dragon Dictation  PLAN OF CARE: Admit for overnight observation  PATIENT DISPOSITION:  PACU - hemodynamically stable.

## 2019-09-25 DIAGNOSIS — M8088XA Other osteoporosis with current pathological fracture, vertebra(e), initial encounter for fracture: Secondary | ICD-10-CM | POA: Diagnosis not present

## 2019-09-25 NOTE — Anesthesia Postprocedure Evaluation (Signed)
Anesthesia Post Note  Patient: Chartered loss adjuster  Procedure(s) Performed: THORACIC TWELVE, LUMBAR ONE KYPHOPLASTY (N/A Spine Lumbar)     Patient location during evaluation: PACU Anesthesia Type: MAC Level of consciousness: awake and alert Pain management: pain level controlled Vital Signs Assessment: post-procedure vital signs reviewed and stable Respiratory status: spontaneous breathing, nonlabored ventilation and respiratory function stable Cardiovascular status: blood pressure returned to baseline and stable Postop Assessment: no apparent nausea or vomiting Anesthetic complications: no    Last Vitals:  Vitals:   09/24/19 2313 09/25/19 0346  BP: 105/62 (!) 106/55  Pulse: 79 73  Resp: 18 18  Temp: 36.9 C 36.8 C  SpO2: 93% 95%    Last Pain:  Vitals:   09/25/19 0601  TempSrc:   PainSc: 3                  Lidia Collum

## 2019-09-25 NOTE — Progress Notes (Signed)
Patient is discharged from room 3C07 at this time. Alert and in stable condition. IV site d/c'd and instructions read to patient and family with understanding verbalized. Left unit via wheelchair with all belongings at side.  

## 2019-09-25 NOTE — Progress Notes (Signed)
Subjective: 1 Day Post-Op Procedure(s) (LRB): THORACIC TWELVE, LUMBAR ONE KYPHOPLASTY (N/A) Patient reports pain as mild.   No events overnight Tolerating PO without N/V +ambulation +void +flatus, -BM Denies CP, SOB, calf pain.  Objective: Vital signs in last 24 hours: Temp:  [97.3 F (36.3 C)-98.4 F (36.9 C)] 98.3 F (36.8 C) (05/13 0346) Pulse Rate:  [73-80] 73 (05/13 0346) Resp:  [18-19] 18 (05/13 0346) BP: (105-145)/(55-68) 106/55 (05/13 0346) SpO2:  [93 %-98 %] 95 % (05/13 0346) Weight:  [65.8 kg] 65.8 kg (05/12 1139)  Intake/Output from previous day: 05/12 0701 - 05/13 0700 In: 660 [P.O.:360; I.V.:300] Out: 10 [Blood:10] Intake/Output this shift: No intake/output data recorded.  No results for input(s): HGB in the last 72 hours. No results for input(s): WBC, RBC, HCT, PLT in the last 72 hours. No results for input(s): NA, K, CL, CO2, BUN, CREATININE, GLUCOSE, CALCIUM in the last 72 hours. No results for input(s): LABPT, INR in the last 72 hours.  Neurologically intact ABD soft Neurovascular intact Sensation intact distally Intact pulses distally Dorsiflexion/Plantar flexion intact Incision: dressing C/D/I No cellulitis present Compartment soft   Assessment/Plan: 1 Day Post-Op Procedure(s) (LRB): THORACIC TWELVE, LUMBAR ONE KYPHOPLASTY (N/A) Advance diet Up with therapy Encourage IS DVT PPx: Teds, SCDs, ambulation  Discharge home today.   Yvonne Kendall Anneli Bing 09/25/2019, 7:52 AM

## 2019-09-26 NOTE — Discharge Summary (Signed)
Patient ID: Allison Zuniga MRN: [id number] DOB/AGE: 1940-05-27 79 y.o.  Admit date: 09/24/2019 Discharge date: 09/26/2019  Admission Diagnoses:  Active Problems:   Lumbar compression fracture [facility])   Discharge Diagnoses:  Active Problems:   Lumbar compression fracture ([facility])  status post Procedure(s): THORACIC TWELVE, LUMBAR ONE KYPHOPLASTY  Past Medical History:  Diagnosis Date  . COPD (chronic obstructive pulmonary disease) ([facility])   . Former smoker   . Hypertension   . Hypothyroidism   . Pneumonia    > 10 years ago per pt     Surgeries: Procedure(s): THORACIC TWELVE, LUMBAR ONE KYPHOPLASTY on 09/24/2019   Consultants:   Discharged Condition: Improved  Hospital Course: Allison Zuniga is an 79 y.o. female who was admitted 09/24/2019 for operative treatment of T12, L1 Compression fracture. Patient failed conservative treatments (please see the history and physical for the specifics) and had severe unremitting pain that affects sleep, daily activities and work/hobbies. After pre-op clearance, the patient was taken to the operating room on 09/24/2019 and underwent  Procedure(s): THORACIC TWELVE, LUMBAR ONE KYPHOPLASTY.    Patient was given perioperative antibiotics:  Anti-infectives (From admission, onward)   Start     Dose/Rate Route Frequency Ordered Stop   09/24/19 1158  vancomycin (VANCOCIN) IVPB 1000 mg/200 mL premix  Status:  Discontinued     1,000 mg 200 mL/hr over 60 Minutes Intravenous 60 min pre-op 09/24/19 1158 09/24/19 1906       Patient was given sequential compression devices and early ambulation to prevent DVT.   Patient benefited maximally from hospital stay and there were no complications. At the time of discharge, the patient was urinating/moving their bowels without difficulty, tolerating a regular diet, pain is controlled with oral pain medications and they have been cleared by PT/OT.   Recent vital signs: No data found.   Recent laboratory studies:  No results for input(s): WBC, HGB, HCT, PLT, NA, K, CL, CO2, BUN, CREATININE, GLUCOSE, INR, CALCIUM in the last 72 hours.  Invalid input(s): PT, 2   Discharge Medications:   Allergies as of 09/25/2019      Reactions   Sulfa Antibiotics Other (See Comments)   Chest tightness   Aspirin Hives   Penicillins Hives      Medication List    STOP taking these medications   Breztri Aerosphere 160-9-4.8 MCG/ACT Aero Generic drug: Budeson-Glycopyrrol-Formoterol   Cyanocobalamin 1000 MCG Subl   meloxicam 7.5 MG tablet Commonly known as: MOBIC   TURMERIC CURCUMIN PO   vitamin C 1000 MG tablet   Vitamin D3 1.25 MG (50000 UT) Caps   Voltaren 1 % Gel Generic drug: diclofenac Sodium     TAKE these medications   acetaminophen 500 MG tablet Commonly known as: TYLENOL Take 1,000 mg by mouth every 6 (six) hours as needed (for pain.).   albuterol 108 (90 Base) MCG/ACT inhaler Commonly known as: VENTOLIN HFA Inhale into the lungs every 6 (six) hours as needed for wheezing or shortness of breath.   atenolol 50 MG tablet Commonly known as: TENORMIN Take 50 mg by mouth 2 (two) times daily.   folic acid 1 MG tablet Commonly known as: FOLVITE Take 1 mg by mouth daily.   irbesartan 150 MG tablet Commonly known as: AVAPRO Take 150 mg by mouth daily.   levothyroxine 75 MCG tablet Commonly known as: SYNTHROID Take 75 mcg by mouth daily before breakfast.   methotrexate 2.5 MG tablet Take 15 mg by mouth every Monday.   ondansetron 4 MG tablet  Commonly known as: Zofran Take 1 tablet (4 mg total) by mouth every 8 (eight) hours as needed for up to 5 days for nausea or vomiting.   Polyethyl Glycol-Propyl Glycol 0.4-0.3 % Soln Place 1 drop into both eyes 3 (three) times daily as needed (dry/irritated eyes.).   predniSONE 5 MG tablet Commonly known as: DELTASONE Take 5 mg by mouth daily.   PreviDent 1.1 % Gel dental gel Generic drug: sodium fluoride Place 1 application onto teeth in  the morning and at bedtime.   tiotropium 18 MCG inhalation capsule Commonly known as: SPIRIVA Place 18 mcg into inhaler and inhale daily.   traMADol 50 MG tablet Commonly known as: Ultram Take 1 tablet (50 mg total) by mouth every 6 (six) hours as needed for up to 5 days.       Diagnostic Studies: DG Chest 2 View  Result Date: 09/19/2019 CLINICAL DATA:  Preop kyphoplasty EXAM: CHEST - 2 VIEW COMPARISON:  CT 04/01/2019 FINDINGS: No acute consolidation or effusion. Bilateral perihilar bronchiectasis. Probable scarring at the right base. Normal heart size. No pneumothorax. Moderate compression deformity at the lower thoracic spine. IMPRESSION: No active cardiopulmonary disease. Bronchiectasis and probable scarring at the right base. Moderate compression deformity at the lower thoracic spine Electronically Signed   By: Donavan Foil M.D.   On: 09/19/2019 20:12   DG Lumbar Spine 2-3 Views  Result Date: 09/24/2019 CLINICAL DATA:  Kyphoplasty EXAM: LUMBAR SPINE - 2-3 VIEW; DG C-ARM 1-60 MIN COMPARISON:  None. FINDINGS: Two intraop views were submitted for review of T12-L1 hypoplastic with cement fixation. Fluoro time 4 minutes 58 seconds IMPRESSION: Intraop fluoro views of kyphoplasty of T12-L1 Electronically Signed   By: Prudencio Pair M.D.   On: 09/24/2019 20:06   DG Lumbar Spine 1 View  Result Date: 09/24/2019 CLINICAL DATA:  Localization for kyphoplasty EXAM: LUMBAR SPINE - 1 VIEW COMPARISON:  None. FINDINGS: Lateral view of the lumbar spine was obtained during performance of the procedure. Evaluation is limited due to portable technique and obliquity. Surgical instrumentation is seen posterior to the T12 vertebral body and L3/L4 disc space. IMPRESSION: 1. Limited evaluation as above, instrumentation posterior to the T12 vertebral body and L3/L4 disc space. Electronically Signed   By: Randa Ngo M.D.   On: 09/24/2019 19:59   DG C-Arm 1-60 Min  Result Date: 09/24/2019 CLINICAL DATA:   Kyphoplasty EXAM: LUMBAR SPINE - 2-3 VIEW; DG C-ARM 1-60 MIN COMPARISON:  None. FINDINGS: Two intraop views were submitted for review of T12-L1 hypoplastic with cement fixation. Fluoro time 4 minutes 58 seconds IMPRESSION: Intraop fluoro views of kyphoplasty of T12-L1 Electronically Signed   By: Prudencio Pair M.D.   On: 09/24/2019 20:06    Discharge Instructions    Incentive spirometry RT   Complete by: As directed         Discharge Plan:  discharge to home  Disposition: stable    Signed: Yvonne Kendall Bevin Mayall for [facility] PA-C [facility] [phone] 09/26/2019, 3:34 PM

## 2019-09-30 ENCOUNTER — Emergency Department (HOSPITAL_BASED_OUTPATIENT_CLINIC_OR_DEPARTMENT_OTHER): Payer: Medicare Other

## 2019-09-30 ENCOUNTER — Other Ambulatory Visit: Payer: Self-pay

## 2019-09-30 ENCOUNTER — Encounter (HOSPITAL_BASED_OUTPATIENT_CLINIC_OR_DEPARTMENT_OTHER): Payer: Self-pay | Admitting: *Deleted

## 2019-09-30 ENCOUNTER — Emergency Department (HOSPITAL_BASED_OUTPATIENT_CLINIC_OR_DEPARTMENT_OTHER)
Admission: EM | Admit: 2019-09-30 | Discharge: 2019-09-30 | Disposition: A | Discharge status: Home / Self Care | Payer: Medicare Other | Attending: Emergency Medicine | Admitting: Emergency Medicine

## 2019-09-30 DIAGNOSIS — E039 Hypothyroidism, unspecified: Secondary | ICD-10-CM | POA: Diagnosis not present

## 2019-09-30 DIAGNOSIS — Z886 Allergy status to analgesic agent status: Secondary | ICD-10-CM | POA: Diagnosis not present

## 2019-09-30 DIAGNOSIS — N3001 Acute cystitis with hematuria: Secondary | ICD-10-CM | POA: Insufficient documentation

## 2019-09-30 DIAGNOSIS — J449 Chronic obstructive pulmonary disease, unspecified: Secondary | ICD-10-CM | POA: Diagnosis not present

## 2019-09-30 DIAGNOSIS — R1031 Right lower quadrant pain: Secondary | ICD-10-CM | POA: Diagnosis present

## 2019-09-30 DIAGNOSIS — I10 Essential (primary) hypertension: Secondary | ICD-10-CM | POA: Diagnosis not present

## 2019-09-30 DIAGNOSIS — Z885 Allergy status to narcotic agent status: Secondary | ICD-10-CM | POA: Insufficient documentation

## 2019-09-30 DIAGNOSIS — N390 Urinary tract infection, site not specified: Secondary | ICD-10-CM

## 2019-09-30 DIAGNOSIS — Z880 Allergy status to penicillin: Secondary | ICD-10-CM | POA: Diagnosis not present

## 2019-09-30 DIAGNOSIS — Z79899 Other long term (current) drug therapy: Secondary | ICD-10-CM | POA: Diagnosis not present

## 2019-09-30 DIAGNOSIS — Z87891 Personal history of nicotine dependence: Secondary | ICD-10-CM | POA: Insufficient documentation

## 2019-09-30 DIAGNOSIS — D259 Leiomyoma of uterus, unspecified: Secondary | ICD-10-CM | POA: Diagnosis not present

## 2019-09-30 DIAGNOSIS — R109 Unspecified abdominal pain: Secondary | ICD-10-CM

## 2019-09-30 DIAGNOSIS — N858 Other specified noninflammatory disorders of uterus: Secondary | ICD-10-CM

## 2019-09-30 DIAGNOSIS — R1900 Intra-abdominal and pelvic swelling, mass and lump, unspecified site: Secondary | ICD-10-CM

## 2019-09-30 DIAGNOSIS — R52 Pain, unspecified: Secondary | ICD-10-CM

## 2019-09-30 LAB — CBC
HCT: 44.2 % (ref 36.0–46.0)
Hemoglobin: 14.3 g/dL (ref 12.0–15.0)
MCH: 29.9 pg (ref 26.0–34.0)
MCHC: 32.4 g/dL (ref 30.0–36.0)
MCV: 92.5 fL (ref 80.0–100.0)
Platelets: 307 10*3/uL (ref 150–400)
RBC: 4.78 MIL/uL (ref 3.87–5.11)
RDW: 13.8 % (ref 11.5–15.5)
WBC: 6.4 10*3/uL (ref 4.0–10.5)
nRBC: 0 % (ref 0.0–0.2)

## 2019-09-30 LAB — LIPASE, BLOOD: Lipase: 20 U/L (ref 11–51)

## 2019-09-30 LAB — COMPREHENSIVE METABOLIC PANEL
ALT: 17 U/L (ref 0–44)
AST: 19 U/L (ref 15–41)
Albumin: 3.7 g/dL (ref 3.5–5.0)
Alkaline Phosphatase: 106 U/L (ref 38–126)
Anion gap: 12 (ref 5–15)
BUN: 9 mg/dL (ref 8–23)
CO2: 29 mmol/L (ref 22–32)
Calcium: 9.2 mg/dL (ref 8.9–10.3)
Chloride: 95 mmol/L — ABNORMAL LOW (ref 98–111)
Creatinine, Ser: 0.63 mg/dL (ref 0.44–1.00)
GFR calc Af Amer: >60 mL/min (ref >60)
GFR calc non Af Amer: >60 mL/min (ref >60)
Glucose, Bld: 103 mg/dL — ABNORMAL HIGH (ref 70–99)
Potassium: 3.8 mmol/L (ref 3.5–5.1)
Sodium: 136 mmol/L (ref 135–145)
Total Bilirubin: 0.7 mg/dL (ref 0.3–1.2)
Total Protein: 7.6 g/dL (ref 6.5–8.1)

## 2019-09-30 LAB — URINALYSIS, MICROSCOPIC (REFLEX): WBC, UA: >50 WBC/hpf (ref 0–5)

## 2019-09-30 LAB — URINALYSIS, ROUTINE W REFLEX MICROSCOPIC
Bilirubin Urine: NEGATIVE
Glucose, UA: NEGATIVE mg/dL
Ketones, ur: NEGATIVE mg/dL
Nitrite: NEGATIVE
Protein, ur: NEGATIVE mg/dL
Specific Gravity, Urine: 1.01 (ref 1.005–1.030)
pH: 7.5 (ref 5.0–8.0)

## 2019-09-30 MED ORDER — IOHEXOL 300 MG/ML  SOLN
100.0000 mL | Freq: Once | INTRAMUSCULAR | Status: AC | PRN
  Administered 2019-09-30: 100 mL via INTRAVENOUS

## 2019-09-30 MED ORDER — CEPHALEXIN 500 MG PO CAPS
500.0000 mg | ORAL_CAPSULE | Freq: Three times a day (TID) | ORAL | 0 refills | Status: DC
Start: 2019-09-30 — End: 2020-11-02

## 2019-09-30 MED ORDER — POLYETHYLENE GLYCOL 3350 17 GM/SCOOP PO POWD: 1.0000 | Freq: Once | ORAL | 0 refills | Status: AC

## 2019-09-30 MED ORDER — SODIUM CHLORIDE 0.9% FLUSH
3.0000 mL | Freq: Once | INTRAVENOUS | Status: DC
  Filled 2019-09-30: qty 3

## 2019-09-30 MED ORDER — SODIUM CHLORIDE 0.9 % IV SOLN
INTRAVENOUS | Status: DC | PRN
  Administered 2019-09-30: 250 mL via INTRAVENOUS

## 2019-09-30 MED ORDER — SODIUM CHLORIDE 0.9 % IV SOLN
1.0000 g | Freq: Once | INTRAVENOUS | Status: AC
  Administered 2019-09-30: 1 g via INTRAVENOUS
  Filled 2019-09-30: qty 10

## 2019-09-30 NOTE — ED Provider Notes (Signed)
[facility] EMERGENCY DEPARTMENT Provider Note   CSN: [id number] Arrival date & time: 09/30/19  1200     History Chief Complaint  Patient presents with  . Abdominal Pain    Allison Zuniga is a 79 y.o. female with history of osteopenia, COPD, HTN, recurrent UTI who presents with abdominal pressure and urinary symptoms.  Patient is accompanied with her daughter who assists with history.  Patient went to have back surgery for several compression fractures last week.  On preadmission screen she was found to have a UTI and was called in [place].  She took that for 4 days and had surgery on May 12.  She did get intraoperative vancomycin.  She was discharged home the next day.  She was prescribed Dulcolax to help move her bowels.  She went to urgent care on May 14 due to having flank pain and urinary symptoms.  She states that she has been having urinary frequency and hesitancy and dribbling.  The urine was clean at that time and urine culture did not grow out anything therefore an antibiotic was not prescribed.  She comes to the ER today because of ongoing flank pain, upper abdominal pressure, and ongoing urinary symptoms.  States she has been having loose stools due to the laxative.  She has had some mild nausea.  She denies fever, chills, chest pain, shortness of breath, midline back pain, vomiting.   HPI     Past Medical History:  Diagnosis Date  . COPD (chronic obstructive pulmonary disease) ([place])   . Former smoker   . Hypertension   . Hypothyroidism   . Pneumonia    > 10 years ago per pt     Patient Active Problem List   Diagnosis Date Noted  . Lumbar compression fracture ([place]) 09/24/2019  . Abnormal echocardiogram 03/14/2019  . 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 Essential hypertension 03/14/2019  . SOB (shortness of breath) 01/24/2019    Past Surgical History:  Procedure Laterality Date  . EYE SURGERY     bilateral cataract removal 2021 per pt     . KYPHOPLASTY N/A 09/24/2019   Procedure: THORACIC TWELVE, LUMBAR ONE KYPHOPLASTY;  Surgeon: Melina Schools, MD;  Location: [facility];  Service: Orthopedics;  Laterality: N/A;  60 ins     OB History   No obstetric history on file.     Family History  Problem Relation Age of Onset  . Asthma Mother     Social History   Tobacco Use  . Smoking status: Former Smoker    Packs/day: 1.00    Years: 30.00    Pack years: 30.00    Quit date: 2015    Years since quitting: 6.3  . Smokeless tobacco: Former Network engineer Use Topics  . Alcohol use: Never  . Drug use: Never    Home Medications Prior to Admission medications   Medication Sig Start Date End Date Taking? Authorizing Provider  acetaminophen (TYLENOL) 500 MG tablet Take 1,000 mg by mouth every 6 (six) hours as needed (for pain.).    [provider]  albuterol (VENTOLIN HFA) 108 (90 Base) MCG/ACT inhaler Inhale into the lungs every 6 (six) hours as needed for wheezing or shortness of breath.    [provider]  atenolol (TENORMIN) 50 MG tablet Take 50 mg by mouth 2 (two) times daily.     [provider]  folic acid (FOLVITE) 1 MG tablet Take 1 mg by mouth daily. 08/19/19   [provider]  irbesartan (AVAPRO) 150 MG tablet Take 150 mg by mouth daily.    [provider]  levothyroxine (SYNTHROID) 75 MCG tablet Take 75 mcg by mouth daily before breakfast.    [provider]  methotrexate 2.5 MG tablet Take 15 mg by mouth every Monday. 08/25/19   [provider]  Polyethyl Glycol-Propyl Glycol 0.4-0.3 % SOLN Place 1 drop into both eyes 3 (three) times daily as needed (dry/irritated eyes.).    [provider]  predniSONE (DELTASONE) 5 MG tablet Take 5 mg by mouth daily. 08/28/19   [provider]  PREVIDENT 1.1 % GEL dental gel Place 1 application onto teeth in the morning and at bedtime. 04/16/19   [provider]  tiotropium (SPIRIVA) 18 MCG  inhalation capsule Place 18 mcg into inhaler and inhale daily.    [provider]    Allergies    Sulfa antibiotics, Aspirin, and Penicillins  Review of Systems   Review of Systems  Constitutional: Negative for chills and fever.  Respiratory: Negative for shortness of breath.   Cardiovascular: Negative for chest pain.  Gastrointestinal: Positive for abdominal distention (bloating), diarrhea (loose stool) and nausea. Negative for abdominal pain and vomiting.  Genitourinary: Positive for difficulty urinating, flank pain and frequency. Negative for dysuria, hematuria, pelvic pain, vaginal bleeding and vaginal discharge.  All other systems reviewed and are negative.   Physical Exam Updated Vital Signs BP 123/72   Pulse 73   Temp 98.5 F (36.9 C) (Oral)   Resp 20   Ht 5\' 2"  (1.575 m)   Wt 65.8 kg   SpO2 92%   BMI 26.53 kg/m   Physical Exam Vitals and nursing note reviewed.  Constitutional:      General: She is not in acute distress.    Appearance: Normal appearance. She is well-developed. She is not ill-appearing.  HENT:     Head: Normocephalic and atraumatic.  Eyes:     General: No scleral icterus.       Right eye: No discharge.        Left eye: No discharge.     Conjunctiva/sclera: Conjunctivae normal.     Pupils: Pupils are equal, round, and reactive to light.  Cardiovascular:     Rate and Rhythm: Normal rate and regular rhythm.  Pulmonary:     Effort: Pulmonary effort is normal. No respiratory distress.     Breath sounds: Normal breath sounds.  Abdominal:     General: There is no distension.     Palpations: Abdomen is soft.     Tenderness: There is no abdominal tenderness. There is right CVA tenderness and left CVA tenderness.  Musculoskeletal:     Cervical back: Normal range of motion.     Comments: Surgical scars over the T and L spine appear to be healing well. No tenderness noted  Skin:    General: Skin is warm and dry.  Neurological:     Mental  Status: She is alert and oriented to person, place, and time.  Psychiatric:        Behavior: Behavior normal.     ED Results / Procedures / Treatments   Labs (all labs ordered are listed, but only abnormal results are displayed) Labs Reviewed  COMPREHENSIVE METABOLIC PANEL - Abnormal; Notable for the following components:      Result Value   Chloride 95 (*)    Glucose, Bld 103 (*)    All other components within normal limits  URINALYSIS, ROUTINE W REFLEX  MICROSCOPIC - Abnormal; Notable for the following components:   APPearance CLOUDY (*)    Hgb urine dipstick SMALL (*)    Leukocytes,Ua LARGE (*)    All other components within normal limits  URINALYSIS, MICROSCOPIC (REFLEX) - Abnormal; Notable for the following components:   Bacteria, UA MANY (*)    All other components within normal limits  URINE CULTURE  LIPASE, BLOOD  CBC    EKG None  Radiology US PELVIS (TRANSABDOMINAL ONLY)  Result Date: 09/30/2019 CLINICAL DATA:  Pelvic mass seen on CT. EXAM: TRANSABDOMINAL ULTRASOUND OF PELVIS TECHNIQUE: Transabdominal ultrasound examination of the pelvis was performed including evaluation of the uterus, ovaries, adnexal regions, and pelvic cul-de-sac. COMPARISON:  CT scan 09/30/2019 FINDINGS: Of note, patient declined endovaginal scanning. Uterus Measurements: 7.3 x 3.5 x 4.6 cm = volume: 60.3 mL. 3.4 x 2.5 x 3.0 cm posterior irregular cystic lesion identified in the uterus, as noted on recent CT. As patient declined endovaginal scanning, this lesion can not be further characterized. Endometrium Not discernible. Right ovary Measurements: Not visualized. Left ovary Measurements: Not visualized. Other findings:  No abnormal free fluid. IMPRESSION: 3.4 x 2.5 x 3.0 cm posterior irregular heterogeneous lesion in the uterus. This could be a posterior degenerated/cystic fibroid or represent abnormal endometrial thickening. Patient declined endovaginal scanning which hinders assessment. Consider  pelvic MRI without and with contrast to further evaluate. Non-emergent MRI should be deferred until patient has been discharged for the acute illness, and can optimally cooperate with positioning and breath-holding instructions. Electronically Signed   By: Misty Stanley M.D.   On: 09/30/2019 17:29   CT Abdomen Pelvis W Contrast  Result Date: 09/30/2019 CLINICAL DATA:  Abdominal distension. Bilateral flank pain. Dysuria. EXAM: CT ABDOMEN AND PELVIS WITH CONTRAST TECHNIQUE: Multidetector CT imaging of the abdomen and pelvis was performed using the standard protocol following bolus administration of intravenous contrast. CONTRAST:  124mL OMNIPAQUE IOHEXOL 300 MG/ML  SOLN COMPARISON:  None. FINDINGS: Lower Chest: No acute findings. Hepatobiliary: No hepatic masses identified. Gallbladder is unremarkable. No evidence of biliary ductal dilatation. Pancreas:  No mass or inflammatory changes. Spleen: Within normal limits in size and appearance. Adrenals/Urinary Tract: No masses identified. No evidence of ureteral calculi or hydronephrosis. Stomach/Bowel: No evidence of obstruction, inflammatory process or abnormal fluid collections. Vascular/Lymphatic: No pathologically enlarged lymph nodes. No abdominal aortic aneurysm. Aortic atherosclerosis noted. Reproductive: A heterogeneous low-attenuation mass is seen within the central uterus which measures 4.1 x 4.0 cm. This could represent an endometrial mass or fibroid with cystic degeneration. Adnexal regions are unremarkable. Other:  None. Musculoskeletal: No suspicious bone lesions identified. Multiple chronic appearing lower thoracic and lumbar vertebral body compression fracture deformities noted. IMPRESSION: 4cm low-attenuation mass in central uterus. Differential diagnosis includes endometrial carcinoma and fibroid with cystic degeneration. Recommend pelvic ultrasound for further evaluation. Aortic Atherosclerosis (ICD10-I70.0). Electronically Signed   By: Marlaine Hind  M.D.   On: 09/30/2019 15:49   CT L-SPINE NO CHARGE  Result Date: 09/30/2019 CLINICAL DATA:  Back pain and bilateral flank pain. Dysuria. Previous lumbar fractures with augmentation. EXAM: CT LUMBAR SPINE WITHOUT CONTRAST TECHNIQUE: Multidetector CT imaging of the lumbar spine was performed without intravenous contrast administration. Multiplanar CT image reconstructions were also generated. COMPARISON:  09/24/2019 limited radiography from vertebral augmentation procedure. FINDINGS: Segmentation: 5 lumbar type vertebral bodies. Alignment: Normal Vertebrae: Previously augmented fractures at T12 and L1. Good distribution of methylmethacrylate. Loss of height at T12 is approximately 50%. Loss of height at L1 is approximately 30-40%. Posterior bowing  of the posterosuperior margin of the T12 vertebral body by 3 mm, not likely compressive. There are superior endplate deformities also present at L2, L3, L4 and L5. The comparison image from 09/24/2019 is a portable cross-table film done for operative localization in the detail is limited. I think these fractures were probably present at that time. Paraspinal and other soft tissues: Aortic atherosclerosis. Disc levels: No significant degenerative disc disease. Ordinary lower lumbar facet osteoarthritis particularly at L4-5 and L5-S1. IMPRESSION: Previously augmented fractures at T12 and L1 have a good appearance. Superior endplate fractures at L2, L3, L4 and L5 which were probably present on the single lateral radiographic image from 09/24/2019. The exact ages are indeterminate however. Electronically Signed   By: Nelson Chimes M.D.   On: 09/30/2019 15:50    Procedures Procedures (including critical care time)  Medications Ordered in ED Medications  sodium chloride flush (NS) 0.9 % injection 3 mL (3 mLs Intravenous Not Given 09/30/19 1433)  0.9 %  sodium chloride infusion (250 mLs Intravenous New Bag/Given 09/30/19 1534)  cefTRIAXone (ROCEPHIN) 1 g in sodium  chloride 0.9 % 100 mL IVPB (0 g Intravenous Stopped 09/30/19 1657)  iohexol (OMNIPAQUE) 300 MG/ML solution 100 mL (100 mLs Intravenous Contrast Given 09/30/19 1510)    ED Course  I have reviewed the triage vital signs and the nursing notes.  Pertinent labs & imaging results that were available during my care of the patient were reviewed by me and considered in my medical decision making (see chart for details).  79 year old female presents with bilateral flank pain, bloating, urinary hesitancy and frequency for the past several days.  She gets frequent UTIs.  Her vital signs are normal here.  On exam she is well-appearing.  Heart is regular rate and rhythm.  Lungs are clear to auscultation.  She does have CVA tenderness which is worse on the left side.  She has no midline back tenderness and wound appears to be healing well.  Blood work was obtained and is reassuring.  She has no leukocytosis and a normal kidney function.  UA looks consistent with UTI with small hemoglobin, large leuks, and many bacteria.  We will send off a urine culture.  CT of the abdomen and pelvis is remarkable for a 4 cm mass in the uterus.  CT of the L-spine is unremarkable for any acute problem. Will order Pelvic US  Pelvic US redemonstrates mass but it's still unclear if this is fibroid vs endometrial thickening or tumor. Pelvic MRI was recommended. Discussed with patient and her daughter to f/u with PCP or OBGYN. Will start pt on Keflex 500mg  TID for 10 days for treatment of pyelo. Also advised stop Ducolax since she is having a lot of bloating and loose stools. Return precautions discussed.  MDM Rules/Calculators/A&P                       Final Clinical Impression(s) / ED Diagnoses Final diagnoses:  Pain  Flank pain  Uterine mass  Urinary tract infection without hematuria, site unspecified    Rx / DC Orders ED Discharge Orders    None       Recardo Evangelist, PA-C 09/30/19 Milon Score,  MD 10/02/19 [phone]

## 2019-09-30 NOTE — Discharge Instructions (Addendum)
Start Keflex 500mg  three times a day for 10 days and please drink plenty of fluids Please follow up with your PCP or OBGYN regarding uterine mass

## 2019-09-30 NOTE — ED Triage Notes (Signed)
She had back surgery a week ago. She is here today with abdominal pain, bloating and scanty urine output.

## 2019-09-30 NOTE — ED Notes (Signed)
Pt to CT

## 2019-10-01 LAB — URINE CULTURE

## 2019-10-22 ENCOUNTER — Ambulatory Visit (INDEPENDENT_AMBULATORY_CARE_PROVIDER_SITE_OTHER): Payer: Medicare Other | Admitting: Pulmonary Disease

## 2019-10-22 ENCOUNTER — Other Ambulatory Visit: Payer: Self-pay

## 2019-10-22 ENCOUNTER — Encounter: Payer: Self-pay | Admitting: Pulmonary Disease

## 2019-10-22 VITALS — BP 110/60 | HR 73 | Temp 97.1°F | Ht 62.0 in | Wt 138.8 lb

## 2019-10-22 DIAGNOSIS — R911 Solitary pulmonary nodule: Secondary | ICD-10-CM

## 2019-10-22 DIAGNOSIS — J449 Chronic obstructive pulmonary disease, unspecified: Secondary | ICD-10-CM

## 2019-10-22 NOTE — Patient Instructions (Addendum)
Thank you for visiting Dr. Valeta Harms at [facility]. Today we recommend the following:  Orders Placed This Encounter  Procedures  . CT Chest Wo Contrast   CT Scan in 6 months, November 2021 Samples of stiolto today   Return in about 6 months (around 04/22/2020).    Please do your part to reduce the spread of COVID-19.

## 2019-10-22 NOTE — Progress Notes (Signed)
Synopsis: Referred in September 2020 for COPD/lung nodules by Mosetta Anis, MD  Subjective:   PATIENT ID: Allison Zuniga GENDER: female DOB: Jun 17, 1940, MRN: [id number]  Chief Complaint  Patient presents with  . Follow-up    COPD    79 year old female, former smoker, secondhand smoke exposure from husband, started smoking 69 yo, 1 packs per day, for ~45 years.  She lives in [place]. Was diagnosed with COPD some where arounf 5 years agos. Recently started on Anoro this past week. She was spiriva for for >10 years. She did have PFTs with about 3 years completed at [facility].   Back in July she had an excerbation. Developed SOB and had DOE. Just completed a course of levaquin started by her PCP. Finished her abx and steroid course last week for ongoing exacerbation symptoms.  She is feeling better since her last exacerbation.  She is only been on Anoro Ellipta approximately 1 week.  She is not sure she has seen much difference in her breathing.  She is using her albuterol inhaler 1-2 times per day.  She does not have a nebulizer at home.  Denies fevers chills night sweats weight loss.  Patient's daughter is Allison Zuniga.  I called and spoke with her as well.  She was appreciative of the update since she could not be present with her mother today in the office due to [facility] visitor restrictions.   OV 03/12/2019: Here today for follow-up regarding COPD management.  Since last office visit was able to obtain CT imaging from [facility] in [place].  Review of the images revealed some areas of tree-in-bud opacities mild bronchiectasis and scarring at the bases.  She was also found to have an 8 mm right lower lobe lung nodule.  We discussed this via telephone back in September.  She has planned HRCT follow-up in 3 months.  Echocardiogram revealed mild AI.  She was referred to cardiology.  This appointment is scheduled for October 30.  Patient doing well today.  She states that she is breathing  much better than where she was before.  She likes being on the Trelegy.  She does feel as if she has having recurrent muscle cramps in her legs and lower back and hips.  She is worried that this may be related to the initiation of the Trelegy inhaler.  However she is breathing much better than she was and does not want to go off if at all possible.  OV 10/22/2019: Here today for follow-up regarding COPD management.  At this time she is back on her home tiotropium dosing that she was on for the past several years.  Recently had back surgery.  She has been recovering from this nicely.  Occasionally still has back pain.  Otherwise her respiratory status is stable.  She rarely uses her albuterol inhaler.  Patient denies hemoptysis.  Denies wheezing or significant dyspnea on exertion.  Patient had CT scan of the chest completed in November of this past year with stable small subcentimeter lung nodules.  Recommending 1 year follow-up.     Past Medical History:  Diagnosis Date  . COPD (chronic obstructive pulmonary disease) ([place])   . Former smoker   . Hypertension   . Hypothyroidism   . Pneumonia    > 10 years ago per pt      Family History  Problem Relation Age of Onset  . Asthma Mother      Past Surgical History:  Procedure Laterality Date  .  EYE SURGERY     bilateral cataract removal 2021 per pt   . KYPHOPLASTY N/A 09/24/2019   Procedure: THORACIC TWELVE, LUMBAR ONE KYPHOPLASTY;  Surgeon: Melina Schools, MD;  Location: [facility];  Service: Orthopedics;  Laterality: N/A;  60 ins    Social History   Socioeconomic History  . Marital status: Married    Spouse name: Not on file  . Number of children: Not on file  . Years of education: Not on file  . Highest education level: Not on file  Occupational History  . Not on file  Tobacco Use  . Smoking status: Former Smoker    Packs/day: 1.00    Years: 30.00    Pack years: 30.00    Quit date: 2015    Years since quitting: 6.4  . Smokeless  tobacco: Former Network engineer and Sexual Activity  . Alcohol use: Never  . Drug use: Never  . Sexual activity: Not on file  Other Topics Concern  . Not on file  Social History Narrative  . Not on file   Social Determinants of Health   Financial Resource Strain:   . Difficulty of Paying Living Expenses:   Food Insecurity:   . Worried About Charity fundraiser in the Last Year:   . Arboriculturist in the Last Year:   Transportation Needs:   . Film/video editor (Medical):   Marland Kitchen Lack of Transportation (Non-Medical):   Physical Activity:   . Days of Exercise per Week:   . Minutes of Exercise per Session:   Stress:   . Feeling of Stress :   Social Connections:   . Frequency of Communication with Friends and Family:   . Frequency of Social Gatherings with Friends and Family:   . Attends Religious Services:   . Active Member of Clubs or Organizations:   . Attends Archivist Meetings:   Marland Kitchen Marital Status:   Intimate Partner Violence:   . Fear of Current or Ex-Partner:   . Emotionally Abused:   Marland Kitchen Physically Abused:   . Sexually Abused:      Allergies  Allergen Reactions  . Sulfa Antibiotics Other (See Comments)    Chest tightness  . Aspirin Hives  . Penicillins Hives     Outpatient Medications Prior to Visit  Medication Sig Dispense Refill  . acetaminophen (TYLENOL) 500 MG tablet Take 1,000 mg by mouth every 6 (six) hours as needed (for pain.).    Marland Kitchen albuterol (VENTOLIN HFA) 108 (90 Base) MCG/ACT inhaler Inhale into the lungs every 6 (six) hours as needed for wheezing or shortness of breath.    Marland Kitchen atenolol (TENORMIN) 50 MG tablet Take 50 mg by mouth 2 (two) times daily.     . cephALEXin (KEFLEX) 500 MG capsule Take 1 capsule (500 mg total) by mouth 3 (three) times daily. 30 capsule 0  . Cholecalciferol 1.25 MG (50000 UT) capsule cholecalciferol (vitamin D3) 1,250 mcg (50,000 unit) capsule    . folic acid (FOLVITE) 1 MG tablet Take 1 mg by mouth daily.    .  irbesartan (AVAPRO) 150 MG tablet Take 150 mg by mouth daily.    Marland Kitchen levothyroxine (SYNTHROID) 75 MCG tablet Take 75 mcg by mouth daily before breakfast.    . methotrexate 2.5 MG tablet Take 15 mg by mouth every Monday.    Vladimir Faster Glycol-Propyl Glycol 0.4-0.3 % SOLN Place 1 drop into both eyes 3 (three) times daily as needed (dry/irritated eyes.).    Marland Kitchen  PREVIDENT 1.1 % GEL dental gel Place 1 application onto teeth in the morning and at bedtime.    Marland Kitchen tiotropium (SPIRIVA) 18 MCG inhalation capsule Place 18 mcg into inhaler and inhale daily.    . predniSONE (DELTASONE) 5 MG tablet Take 5 mg by mouth daily.    . traMADol (ULTRAM) 50 MG tablet Take 50 mg by mouth 3 (three) times daily.     No facility-administered medications prior to visit.    Review of Systems  Constitutional: Negative for chills, fever, malaise/fatigue and weight loss.  HENT: Negative for hearing loss, sore throat and tinnitus.   Eyes: Negative for blurred vision and double vision.  Respiratory: Negative for cough, hemoptysis, sputum production, shortness of breath, wheezing and stridor.   Cardiovascular: Negative for chest pain, palpitations, orthopnea, leg swelling and PND.  Gastrointestinal: Negative for abdominal pain, constipation, diarrhea, heartburn, nausea and vomiting.  Genitourinary: Negative for dysuria, hematuria and urgency.  Musculoskeletal: Positive for back pain, joint pain and myalgias.  Skin: Negative for itching and rash.  Neurological: Negative for dizziness, tingling, weakness and headaches.  Endo/Heme/Allergies: Negative for environmental allergies. Does not bruise/bleed easily.  Psychiatric/Behavioral: Negative for depression. The patient is not nervous/anxious and does not have insomnia.   All other systems reviewed and are negative.    Objective:  Physical Exam Vitals reviewed.  Constitutional:      General: She is not in acute distress.    Appearance: She is well-developed.  HENT:      Head: Normocephalic and atraumatic.     Mouth/Throat:     Pharynx: No oropharyngeal exudate.  Eyes:     Conjunctiva/sclera: Conjunctivae normal.     Pupils: Pupils are equal, round, and reactive to light.  Neck:     Vascular: No JVD.     Trachea: No tracheal deviation.     Comments: Loss of supraclavicular fat Cardiovascular:     Rate and Rhythm: Normal rate and regular rhythm.     Heart sounds: S1 normal and S2 normal.     Comments: Distant heart tones Pulmonary:     Effort: No tachypnea or accessory muscle usage.     Breath sounds: No stridor. Decreased breath sounds (throughout all lung fields) present. No wheezing, rhonchi or rales.  Abdominal:     General: Bowel sounds are normal. There is no distension.     Palpations: Abdomen is soft.     Tenderness: There is no abdominal tenderness.  Musculoskeletal:        General: No deformity (muscle wasting ).  Skin:    General: Skin is warm and dry.     Capillary Refill: Capillary refill takes less than 2 seconds.     Findings: No rash.  Neurological:     Mental Status: She is alert and oriented to person, place, and time.  Psychiatric:        Behavior: Behavior normal.      Vitals:   10/22/19 1432  BP: 110/60  Pulse: 73  Temp: (!) 97.1 F (36.2 C)  TempSrc: Oral  SpO2: 97%  Weight: 138 lb 12.8 oz (63 kg)  Height: 5\' 2"  (1.575 m)   97% on  RA BMI Readings from Last 3 Encounters:  10/22/19 25.39 kg/m  09/30/19 26.53 kg/m  09/24/19 26.52 kg/m   Wt Readings from Last 3 Encounters:  10/22/19 138 lb 12.8 oz (63 kg)  09/30/19 145 lb 1 oz (65.8 kg)  09/24/19 145 lb (65.8 kg)     CBC  Component Value Date/Time   WBC 6.4 09/30/2019 1343   RBC 4.78 09/30/2019 1343   HGB 14.3 09/30/2019 1343   HCT 44.2 09/30/2019 1343   PLT 307 09/30/2019 1343   MCV 92.5 09/30/2019 1343   MCH 29.9 09/30/2019 1343   MCHC 32.4 09/30/2019 1343   RDW 13.8 09/30/2019 1343     Chest Imaging:     Chest x-ray from Dr.  Ainsley Spinner office.  Density within the right lower lobe, infiltrate in the left lower lobe.  Upper lobe evidence of emphysema. The patient's images have been independently reviewed by me.    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  Some areas of mild bronchiectasis. Overall lung nodules are stable. The patient's images have been independently reviewed by me.    Pulmonary Functions Testing Results: No flowsheet data found.  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 Lung nodule  R91.1 CT Chest Wo Contrast  2. Chronic obstructive pulmonary disease, unspecified COPD type ([place])  J44.9   3. Nodule of lower lobe of right lung  R91.1     Discussion:  This is a 79 year old female past medical history of COPD prior PFTs in [facility].  Initially found to have a 8 mm right lower lobe pulmonary nodule with planned CT follow-up in November 2021.  Recently had back surgery.  Recovering from this.  COPD currently managed with Spiriva.  She was on Trelegy for a while which definitely improved symptoms but she had recurrent urinary tract infection.  She does have follow-up with urology soon.  Plan: Patient would prefer to go back on Trelegy but also interested in potentially trying something else. Samples today given of Stiolto. Planned noncontrasted CT follow-up in November 2021. CT imaging from November 2020 reviewed today in the office with patient. Patient to follow-up in clinic in 6 months following CT scan imaging.  Greater than 50% of this patient's 32-minute of visit was been face-to-face discussing above recommendations treatment plan.   Current Outpatient Medications:  .  acetaminophen (TYLENOL) 500 MG tablet, Take 1,000 mg by mouth every 6 (six) hours as needed  (for pain.)., Disp: , Rfl:  .  albuterol (VENTOLIN HFA) 108 (90 Base) MCG/ACT inhaler, Inhale into the lungs every 6 (six) hours as needed for wheezing or shortness of breath., Disp: , Rfl:  .  atenolol (TENORMIN) 50 MG tablet, Take 50 mg by mouth 2 (two) times daily. , Disp: , Rfl:  .  cephALEXin (KEFLEX) 500 MG capsule, Take 1 capsule (500 mg total) by mouth 3 (three) times daily., Disp: 30 capsule, Rfl: 0 .  Cholecalciferol 1.25 MG (50000 UT) capsule, cholecalciferol (vitamin D3) 1,250 mcg (50,000 unit) capsule, Disp: , Rfl:  .  folic acid (FOLVITE) 1 MG tablet, Take 1 mg by mouth daily., Disp: , Rfl:  .  irbesartan (AVAPRO) 150 MG tablet, Take 150 mg by mouth daily., Disp: , Rfl:  .  levothyroxine (SYNTHROID) 75 MCG tablet, Take 75 mcg by mouth daily before breakfast., Disp: , Rfl:  .  methotrexate 2.5 MG tablet, Take 15 mg by mouth every Monday., Disp: , Rfl:  .  Polyethyl Glycol-Propyl Glycol 0.4-0.3 % SOLN, Place 1 drop into both eyes 3 (three) times daily as needed (dry/irritated eyes.)., Disp: ,  Rfl:  .  PREVIDENT 1.1 % GEL dental gel, Place 1 application onto teeth in the morning and at bedtime., Disp: , Rfl:  .  tiotropium (SPIRIVA) 18 MCG inhalation capsule, Place 18 mcg into inhaler and inhale daily., Disp: , Rfl:    Garner Nash, DO [facility] 10/22/2019 2:59 PM

## 2020-01-05 DIAGNOSIS — M810 Age-related osteoporosis without current pathological fracture: Secondary | ICD-10-CM | POA: Insufficient documentation

## 2020-02-16 ENCOUNTER — Telehealth: Payer: Self-pay | Admitting: Cardiology

## 2020-02-16 NOTE — Telephone Encounter (Signed)
Spoke with patient, she's moving and will call back to schedule once she's settled in.

## 2020-03-08 ENCOUNTER — Telehealth: Payer: Self-pay | Admitting: Pulmonary Disease

## 2020-03-08 NOTE — Telephone Encounter (Signed)
I have spoken with Allison Zuniga and I told her I would get her CT scheduled for her in the morning and call her back

## 2020-03-09 NOTE — Telephone Encounter (Signed)
I have now spoken with  Allison Zuniga she is aware of  her CT appt. The CT has been scheduled on 04/20/20 @ 1:40pm arrival time 1:20pm at [place]

## 2020-04-02 ENCOUNTER — Encounter: Payer: Self-pay | Admitting: Cardiology

## 2020-04-20 ENCOUNTER — Ambulatory Visit
Admission: RE | Admit: 2020-04-20 | Discharge: 2020-04-20 | Disposition: A | Discharge status: Home / Self Care | Payer: Medicare Other | Source: Ambulatory Visit | Attending: Pulmonary Disease | Admitting: Pulmonary Disease

## 2020-04-20 DIAGNOSIS — R911 Solitary pulmonary nodule: Secondary | ICD-10-CM

## 2020-04-28 ENCOUNTER — Ambulatory Visit (INDEPENDENT_AMBULATORY_CARE_PROVIDER_SITE_OTHER): Payer: Medicare Other | Admitting: Pulmonary Disease

## 2020-04-28 ENCOUNTER — Encounter: Payer: Self-pay | Admitting: Pulmonary Disease

## 2020-04-28 ENCOUNTER — Other Ambulatory Visit: Payer: Self-pay

## 2020-04-28 VITALS — BP 138/70 | HR 63 | Temp 99.2°F | Wt 134.2 lb

## 2020-04-28 DIAGNOSIS — J449 Chronic obstructive pulmonary disease, unspecified: Secondary | ICD-10-CM | POA: Diagnosis not present

## 2020-04-28 DIAGNOSIS — J479 Bronchiectasis, uncomplicated: Secondary | ICD-10-CM

## 2020-04-28 DIAGNOSIS — R0602 Shortness of breath: Secondary | ICD-10-CM | POA: Diagnosis not present

## 2020-04-28 DIAGNOSIS — R911 Solitary pulmonary nodule: Secondary | ICD-10-CM

## 2020-04-28 DIAGNOSIS — J42 Unspecified chronic bronchitis: Secondary | ICD-10-CM | POA: Diagnosis not present

## 2020-04-28 MED ORDER — TRELEGY ELLIPTA 100-62.5-25 MCG/INH IN AEPB: 1.0000 | INHALATION_SPRAY | Freq: Every day | RESPIRATORY_TRACT | 0 refills | Status: DC

## 2020-04-28 NOTE — Progress Notes (Signed)
Synopsis: Referred in September 2020 for COPD/lung nodules by Mosetta Anis, MD  Subjective:   PATIENT ID: Allison Zuniga GENDER: female DOB: Aug 26, 1940, MRN: [id number]  Chief Complaint  Patient presents with  . Follow-up    6 month follow up.  Discuss scan results.     79 year old female, former smoker, secondhand smoke exposure from husband, started smoking 33 yo, 1 packs per day, for ~45 years.  She lives in [place]. Was diagnosed with COPD some where arounf 5 years agos. Recently started on Anoro this past week. She was spiriva for for >10 years. She did have PFTs with about 3 years completed at [facility].   Back in July she had an excerbation. Developed SOB and had DOE. Just completed a course of levaquin started by her PCP. Finished her abx and steroid course last week for ongoing exacerbation symptoms.  She is feeling better since her last exacerbation.  She is only been on Anoro Ellipta approximately 1 week.  She is not sure she has seen much difference in her breathing.  She is using her albuterol inhaler 1-2 times per day.  She does not have a nebulizer at home.  Denies fevers chills night sweats weight loss.  Patient's daughter is [name].  I called and spoke with her as well.  She was appreciative of the update since she could not be present with her mother today in the office due to [place] visitor restrictions.   OV 03/12/2019: Here today for follow-up regarding COPD management.  Since last office visit was able to obtain CT imaging from [facility] in [place].  Review of the images revealed some areas of tree-in-bud opacities mild bronchiectasis and scarring at the bases.  She was also found to have an 8 mm right lower lobe lung nodule.  We discussed this via telephone back in September.  She has planned HRCT follow-up in 3 months.  Echocardiogram revealed mild AI.  She was referred to cardiology.  This appointment is scheduled for October 30.  Patient doing well  today.  She states that she is breathing much better than where she was before.  She likes being on the Trelegy.  She does feel as if she has having recurrent muscle cramps in her legs and lower back and hips.  She is worried that this may be related to the initiation of the Trelegy inhaler.  However she is breathing much better than she was and does not want to go off if at all possible.  OV 10/22/2019: Here today for follow-up regarding COPD management.  At this time she is back on her home tiotropium dosing that she was on for the past several years.  Recently had back surgery.  She has been recovering from this nicely.  Occasionally still has back pain.  Otherwise her respiratory status is stable.  She rarely uses her albuterol inhaler.  Patient denies hemoptysis.  Denies wheezing or significant dyspnea on exertion.  Patient had CT scan of the chest completed in November of this past year with stable small subcentimeter lung nodules.  Recommending 1 year follow-up.  OV 04/28/2020: Patient here today for follow-up regarding recent CT scan.CT scan of the chest was completed on 04/21/2020.  This revealed stability of the patient's 8 mm nodule noted in the right lower lobe.  The prior lingular nodule has resolved.  She does have evidence of bronchiectasis and considerations for atypical inflammation such as MAI.  From a respiratory standpoint patient has been  doing well.  She does feel like she was doing better breathing while she was on Trelegy and would like to go back on this.     Past Medical History:  Diagnosis Date  . COPD (chronic obstructive pulmonary disease) ([place])   . Former smoker   . Hypertension   . Hypothyroidism   . Pneumonia    > 10 years ago per pt      Family History  Problem Relation Age of Onset  . Asthma Mother      Past Surgical History:  Procedure Laterality Date  . EYE SURGERY     bilateral cataract removal 2021 per pt   . KYPHOPLASTY N/A 09/24/2019   Procedure:  THORACIC TWELVE, LUMBAR ONE KYPHOPLASTY;  Surgeon: Melina Schools, MD;  Location: [facility];  Service: Orthopedics;  Laterality: N/A;  60 ins    Social History   Socioeconomic History  . Marital status: Married    Spouse name: Not on file  . Number of children: Not on file  . Years of education: Not on file  . Highest education level: Not on file  Occupational History  . Not on file  Tobacco Use  . Smoking status: Former Smoker    Packs/day: 1.00    Years: 30.00    Pack years: 30.00    Quit date: 2015    Years since quitting: 6.9  . Smokeless tobacco: Former Network engineer  . Vaping Use: Never used  Substance and Sexual Activity  . Alcohol use: Never  . Drug use: Never  . Sexual activity: Not on file  Other Topics Concern  . Not on file  Social History Narrative  . Not on file   Social Determinants of Health   Financial Resource Strain: Not on file  Food Insecurity: Not on file  Transportation Needs: Not on file  Physical Activity: Not on file  Stress: Not on file  Social Connections: Not on file  Intimate Partner Violence: Not on file     Allergies  Allergen Reactions  . Sulfa Antibiotics Other (See Comments)    Chest tightness  . Aspirin Hives  . Penicillins Hives     Outpatient Medications Prior to Visit  Medication Sig Dispense Refill  . albuterol (VENTOLIN HFA) 108 (90 Base) MCG/ACT inhaler Inhale into the lungs every 6 (six) hours as needed for wheezing or shortness of breath.    Marland Kitchen atenolol (TENORMIN) 50 MG tablet Take 50 mg by mouth 2 (two) times daily.     . folic acid (FOLVITE) 1 MG tablet Take 2 mg by mouth daily.    . irbesartan (AVAPRO) 150 MG tablet Take 150 mg by mouth daily.    Marland Kitchen levothyroxine (SYNTHROID) 75 MCG tablet Take 75 mcg by mouth daily before breakfast.    . methotrexate 2.5 MG tablet Take 15 mg by mouth every Monday.    Marland Kitchen PREVIDENT 1.1 % GEL dental gel Place 1 application onto teeth in the morning and at bedtime.    Marland Kitchen acetaminophen  (TYLENOL) 500 MG tablet Take 1,000 mg by mouth every 6 (six) hours as needed (for pain.). (Patient not taking: Reported on 04/28/2020)    . cephALEXin (KEFLEX) 500 MG capsule Take 1 capsule (500 mg total) by mouth 3 (three) times daily. (Patient not taking: Reported on 04/28/2020) 30 capsule 0  . Cholecalciferol 1.25 MG (50000 UT) capsule cholecalciferol (vitamin D3) 1,250 mcg (50,000 unit) capsule (Patient not taking: Reported on 04/28/2020)    . Polyethyl Glycol-Propyl  Glycol 0.4-0.3 % SOLN Place 1 drop into both eyes 3 (three) times daily as needed (dry/irritated eyes.). (Patient not taking: Reported on 04/28/2020)    . tiotropium (SPIRIVA) 18 MCG inhalation capsule Place 18 mcg into inhaler and inhale daily. (Patient not taking: Reported on 04/28/2020)     No facility-administered medications prior to visit.    Review of Systems  Constitutional: Negative for chills, fever, malaise/fatigue and weight loss.  HENT: Negative for hearing loss, sore throat and tinnitus.   Eyes: Negative for blurred vision and double vision.  Respiratory: Positive for shortness of breath. Negative for cough, hemoptysis, sputum production, wheezing and stridor.   Cardiovascular: Negative for chest pain, palpitations, orthopnea, leg swelling and PND.  Gastrointestinal: Negative for abdominal pain, constipation, diarrhea, heartburn, nausea and vomiting.  Genitourinary: Negative for dysuria, hematuria and urgency.  Musculoskeletal: Negative for joint pain and myalgias.  Skin: Negative for itching and rash.  Neurological: Negative for dizziness, tingling, weakness and headaches.  Endo/Heme/Allergies: Negative for environmental allergies. Does not bruise/bleed easily.  Psychiatric/Behavioral: Negative for depression. The patient is not nervous/anxious and does not have insomnia.   All other systems reviewed and are negative.    Objective:  Physical Exam Vitals reviewed.  Constitutional:      General: She is not  in acute distress.    Appearance: She is well-developed and well-nourished.  HENT:     Head: Normocephalic and atraumatic.     Mouth/Throat:     Mouth: Oropharynx is clear and moist.  Eyes:     General: No scleral icterus.    Conjunctiva/sclera: Conjunctivae normal.     Pupils: Pupils are equal, round, and reactive to light.  Neck:     Vascular: No JVD.     Trachea: No tracheal deviation.  Cardiovascular:     Rate and Rhythm: Normal rate and regular rhythm.     Pulses: Intact distal pulses.     Heart sounds: Normal heart sounds. No murmur heard.   Pulmonary:     Effort: Pulmonary effort is normal. No tachypnea, accessory muscle usage or respiratory distress.     Breath sounds: Normal breath sounds. No stridor. No wheezing, rhonchi or rales.  Abdominal:     General: Bowel sounds are normal. There is no distension.     Palpations: Abdomen is soft.     Tenderness: There is no abdominal tenderness.  Musculoskeletal:        General: No tenderness or edema.     Cervical back: Neck supple.  Lymphadenopathy:     Cervical: No cervical adenopathy.  Skin:    General: Skin is warm and dry.     Capillary Refill: Capillary refill takes less than 2 seconds.     Findings: No rash.  Neurological:     Mental Status: She is alert and oriented to person, place, and time.  Psychiatric:        Mood and Affect: Mood and affect normal.        Behavior: Behavior normal.      Vitals:   04/28/20 1457  BP: 138/70  Pulse: 63  Temp: 99.2 F (37.3 C)  TempSrc: Tympanic  SpO2: 95%  Weight: 134 lb 4 oz (60.9 kg)   95% on  RA BMI Readings from Last 3 Encounters:  04/28/20 24.55 kg/m  10/22/19 25.39 kg/m  09/30/19 26.53 kg/m   Wt Readings from Last 3 Encounters:  04/28/20 134 lb 4 oz (60.9 kg)  10/22/19 138 lb 12.8 oz (63 kg)  09/30/19 145 lb 1 oz (65.8 kg)     CBC    Component Value Date/Time   WBC 6.4 09/30/2019 1343   RBC 4.78 09/30/2019 1343   HGB 14.3 09/30/2019 1343    HCT 44.2 09/30/2019 1343   PLT 307 09/30/2019 1343   MCV 92.5 09/30/2019 1343   MCH 29.9 09/30/2019 1343   MCHC 32.4 09/30/2019 1343   RDW 13.8 09/30/2019 1343     Chest Imaging:     Chest x-ray from Dr. Ainsley Spinner office.  Density within the right lower lobe, infiltrate in the left lower lobe.  Upper lobe evidence of emphysema. The patient's images have been independently reviewed by me.    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  Some areas of mild bronchiectasis. Overall lung nodules are stable. The patient's images have been independently reviewed by me.    Pulmonary Functions Testing Results: No flowsheet data found.  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 Lung nodule  R91.1 CT Super D Chest Wo Contrast    Fluticasone-Umeclidin-Vilant (TRELEGY ELLIPTA) 100-62.5-25 MCG/INH AEPB  2. Chronic obstructive pulmonary disease, unspecified COPD type ([place])  J44.9   3. Chronic bronchitis, unspecified chronic bronchitis type ([place])  J42   4. SOB (shortness of breath)  R06.02   5. Bronchiectasis without complication (H[facility])  W10.9     Discussion:  79 year old female, COPD, lower lobe lung nodule.  Stable at this time on CT imaging.  She does have some changes of bronchiectasis and tree-in-bud opacities concerning for possible underlying MAI  We reviewed the patient's CT scan in detail with daughter, husband and patient at bedside today.  Plan: Patient prefer to go back on Trelegy. Restarted this today given patient samples.  If she likes samples happy to send prescription to her pharmacy she will let us know. Follow-up noncontrasted CT of the chest in 1 year for nodule follow-up. Patient was counseled on progressive symptoms of MAI/NTM infection.  If she  has any of these would consider sputum culture for evaluation and or bronchoscopy in the future if needed.  But at this time she has no other significant respiratory symptoms.    Current Outpatient Medications:  .  albuterol (VENTOLIN HFA) 108 (90 Base) MCG/ACT inhaler, Inhale into the lungs every 6 (six) hours as needed for wheezing or shortness of breath., Disp: , Rfl:  .  atenolol (TENORMIN) 50 MG tablet, Take 50 mg by mouth 2 (two) times daily. , Disp: , Rfl:  .  folic acid (FOLVITE) 1 MG tablet, Take 2 mg by mouth daily., Disp: , Rfl:  .  irbesartan (AVAPRO) 150 MG tablet, Take 150 mg by mouth daily., Disp: , Rfl:  .  levothyroxine (SYNTHROID) 75 MCG tablet, Take 75 mcg by mouth daily before breakfast., Disp: , Rfl:  .  methotrexate 2.5 MG tablet, Take 15 mg by mouth every Monday., Disp: , Rfl:  .  PREVIDENT 1.1 % GEL dental gel, Place 1 application onto teeth in the morning and at bedtime., Disp: , Rfl:  .  acetaminophen (TYLENOL) 500 MG tablet, Take 1,000 mg by mouth every 6 (six) hours as needed (for pain.). (Patient not taking: Reported on 04/28/2020), Disp: , Rfl:  .  cephALEXin (KEFLEX) 500 MG capsule, Take  1 capsule (500 mg total) by mouth 3 (three) times daily. (Patient not taking: Reported on 04/28/2020), Disp: 30 capsule, Rfl: 0 .  Cholecalciferol 1.25 MG (50000 UT) capsule, cholecalciferol (vitamin D3) 1,250 mcg (50,000 unit) capsule (Patient not taking: Reported on 04/28/2020), Disp: , Rfl:  .  Polyethyl Glycol-Propyl Glycol 0.4-0.3 % SOLN, Place 1 drop into both eyes 3 (three) times daily as needed (dry/irritated eyes.). (Patient not taking: Reported on 04/28/2020), Disp: , Rfl:  .  tiotropium (SPIRIVA) 18 MCG inhalation capsule, Place 18 mcg into inhaler and inhale daily. (Patient not taking: Reported on 04/28/2020), Disp: , Rfl:     Garner Nash, DO [facility] 04/28/2020 3:05 PM

## 2020-04-28 NOTE — Patient Instructions (Signed)
Thank you for visiting Dr. Valeta Harms at [facility]. Today we recommend the following:  Orders Placed This Encounter  Procedures  . CT Super D Chest Wo Contrast   Trelegy 124mcg Samples, and instruction   Return in about 1 year (around 04/28/2021) for Dr. Valeta Harms .    Please do your part to reduce the spread of COVID-19.

## 2020-05-11 IMAGING — CT CT CHEST HIGH RESOLUTION W/O CM
2 of 8 series · 12 of 36 positions shown, 15 images · non-contrast
Comparison: Report from 01/10/2019 outside chest CT (images not
available).

CLINICAL DATA: Dyspnea. Follow-up pulmonary nodule. Former smoker.
COPD.

EXAM:
CT CHEST WITHOUT CONTRAST
TECHNIQUE: Multidetector CT imaging of the chest was performed following the
standard protocol without intravenous contrast. High resolution
imaging of the lungs, as well as inspiratory and expiratory imaging,
was performed.

[Series 5: inspiration hi-res 2.00 br36 s3 · coronal · 0.54mm/px · 3 of 150 slices shown]
[im 30/150  lung]
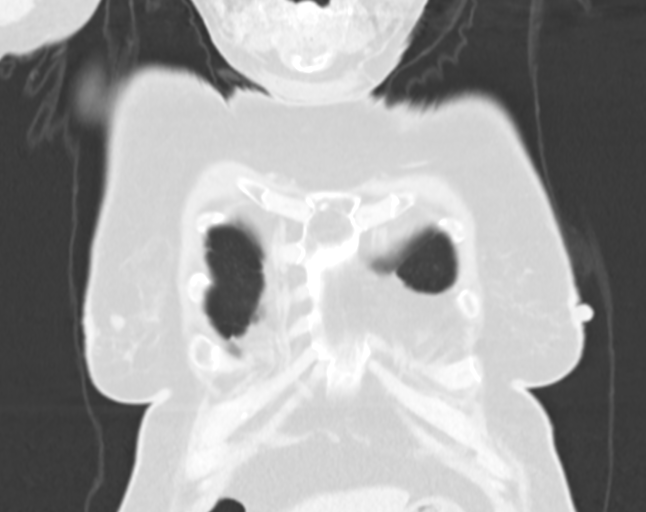
[im 60/150  lung]
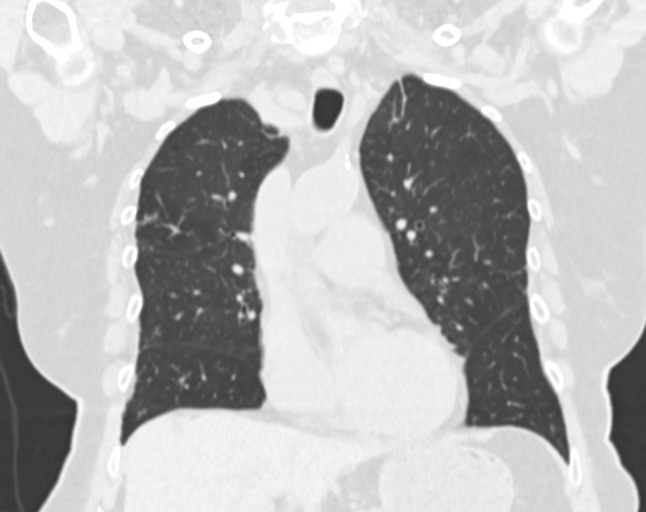
[im 90/150  lung]
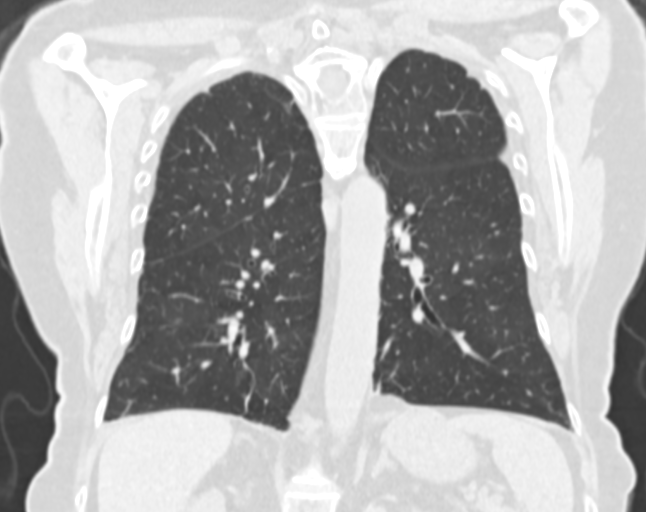

[Series 12: inspiration hi-res 1.00 br60 s3 high res thins 1x1 · axial · 0.59mm/px · z∈[+1584,+1814]mm · 9 of 277 slices shown, 12 images]
[im 24/277  mediastinal]
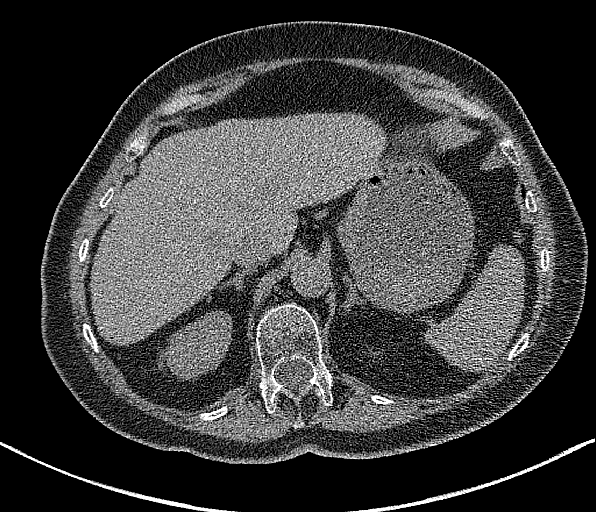
[im 24/277  lung]
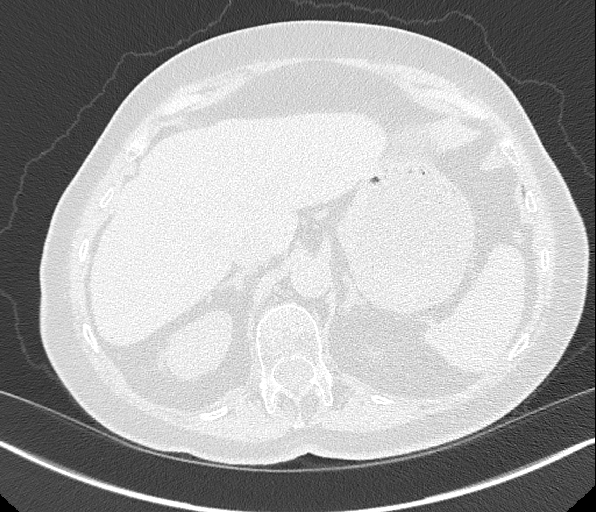
[im 47/277  lung]
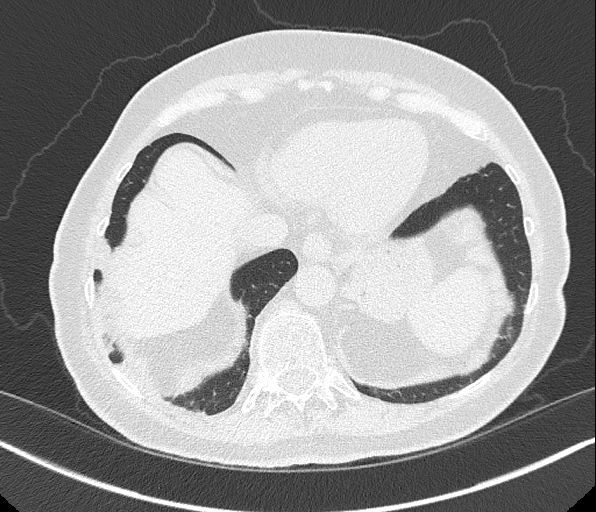
[im 93/277  lung]
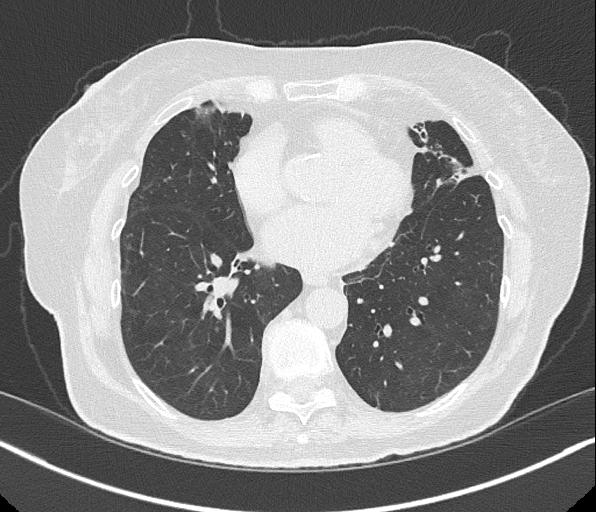
[im 116/277  lung]
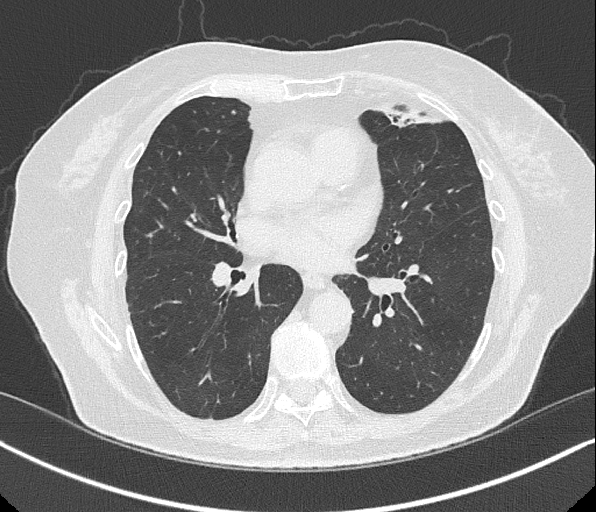
[im 139/277  mediastinal]
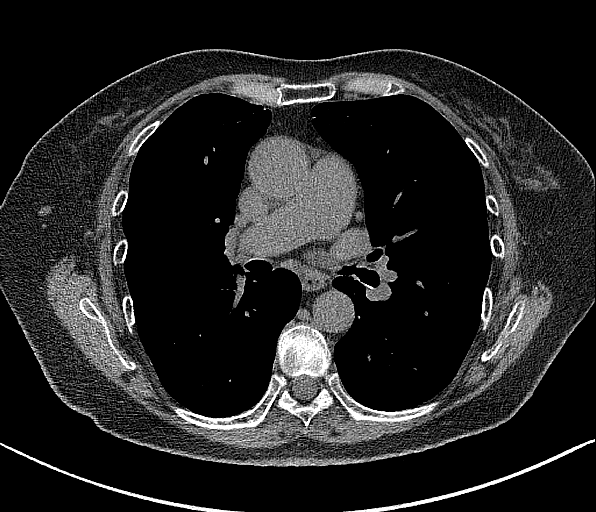
[im 139/277  lung]
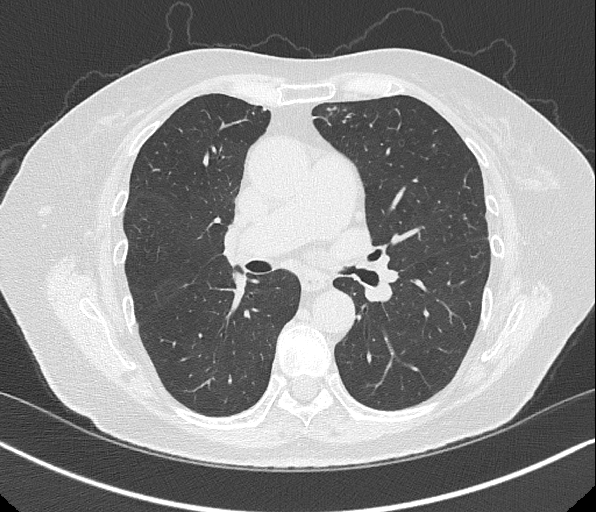
[im 162/277  lung]
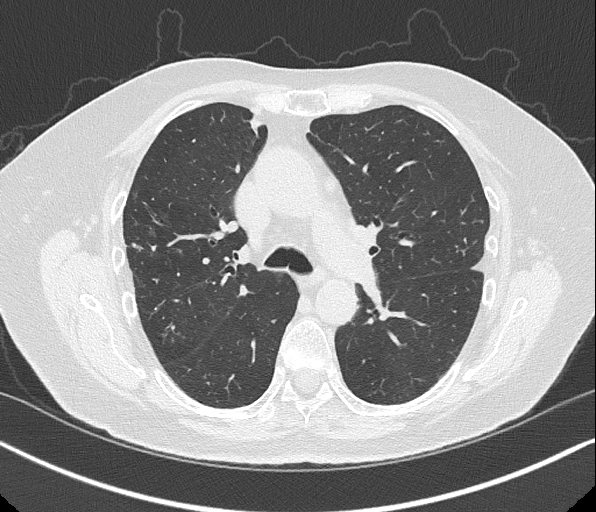
[im 185/277  lung]
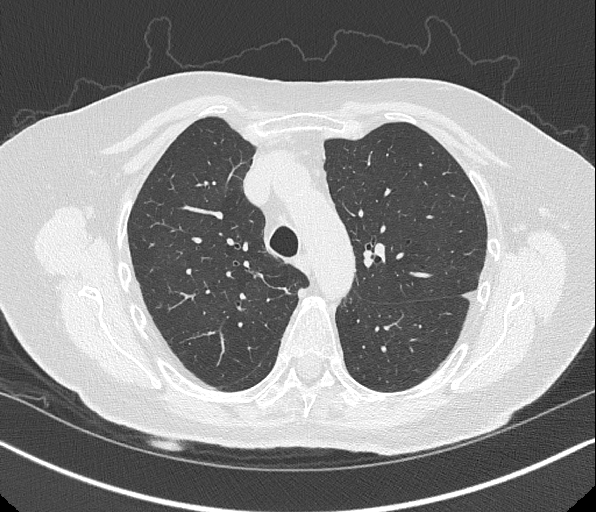
[im 231/277  lung]
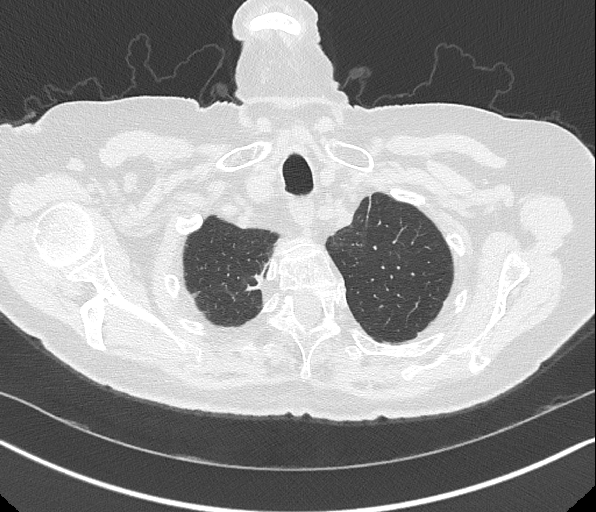
[im 254/277  mediastinal]
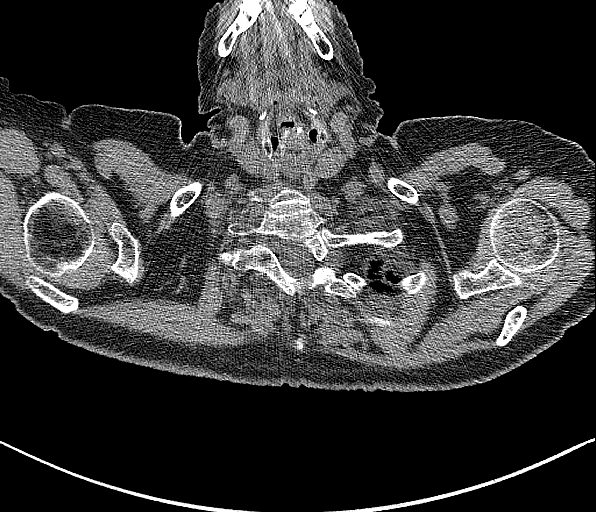
[im 254/277  lung]
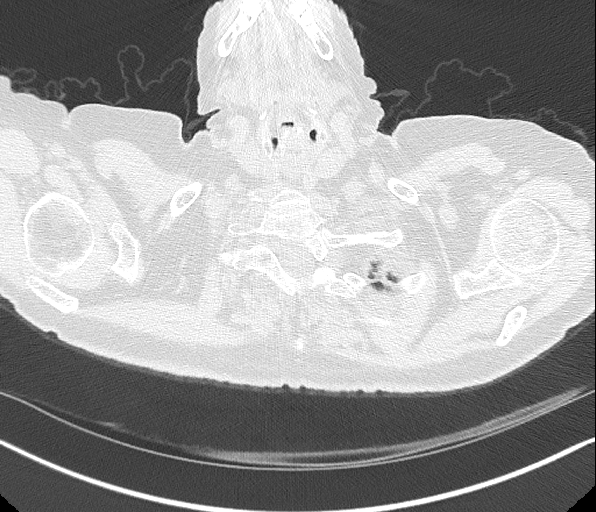

[12 of 36 positions shown; findings below may reference images not displayed]

FINDINGS: Cardiovascular: Normal heart size. No significant pericardial
effusion/thickening. Three-vessel coronary atherosclerosis.
Atherosclerotic nonaneurysmal thoracic aorta. Top-normal caliber
main pulmonary artery (3.3 cm diameter).

Mediastinum/Nodes: Thyroid is either atrophic or surgically absent.
Unremarkable esophagus. No pathologically enlarged axillary,
mediastinal or hilar lymph nodes, noting limited sensitivity for the
detection of hilar adenopathy on this noncontrast study.

Lungs/Pleura: No pneumothorax. No pleural effusion. There is
widespread mild cylindrical and varicoid bronchiectasis in both
lungs, most prominent in the right middle lobe, lingula and apical
right upper lobe. There is associated mild patchy tree-in-bud
opacities in both lungs at the areas of bronchiectasis. There are
scattered solid pulmonary nodules in both lungs, largest 9 x 6 mm
with average diameter 8 mm in the right lower lobe (series 9/image
87) and 8 mm in the left upper lobe (series 9/image 76). Scattered
subpleural bandlike consolidation at the areas of bronchiectasis in
the mid lungs bilaterally and apical upper lobes, compatible with
mild postinfectious scarring.

Upper abdomen: No acute abnormality.

Musculoskeletal: No aggressive appearing focal osseous lesions. Mild
thoracic spondylosis.
IMPRESSION: 1. Widespread mild cylindrical and varicoid bronchiectasis in both
lungs with associated mild patchy tree-in-bud opacities, most
prominent in the right middle lobe, lingula and apical right upper
lobe, suggesting chronic infectious bronchiolitis due to atypical
mycobacterial infection ([facility]).
2. Several scattered solid pulmonary nodules in the lungs
bilaterally, largest with average diameter 8 mm in the right lower
lobe and 8 mm in the left upper lobe. Accurate comparison to the
01/10/2019 chest CT study cannot be made unless the outside images
are submitted for direct comparison. Given the presence of
bronchiectasis and tree-in-bud opacities, these nonspecific nodules
may be inflammatory. If the outside chest CT images are submitted
for direct comparison, an addendum will be made at that time. If the
outside chest CT images cannot be obtained, non-contrast chest CT at
3-6 months is recommended. If the nodules are stable at time of
repeat CT, then future CT at 18-24 months (from today's scan) is
considered optional for low-risk patients, but is recommended for
high-risk patients. This recommendation follows the consensus
statement: Guidelines for Management of Incidental Pulmonary Nodules
Detected on CT Images: From the [HOSPITAL] 6431; Radiology
6431; [DATE].
3. Three-vessel coronary atherosclerosis.

Aortic Atherosclerosis (HII2G-3Z2.2).

## 2020-06-29 ENCOUNTER — Telehealth: Payer: Self-pay | Admitting: Pulmonary Disease

## 2020-06-29 DIAGNOSIS — R911 Solitary pulmonary nodule: Secondary | ICD-10-CM

## 2020-06-29 MED ORDER — TRELEGY ELLIPTA 100-62.5-25 MCG/INH IN AEPB: 1.0000 | INHALATION_SPRAY | Freq: Every day | RESPIRATORY_TRACT | 6 refills | Status: DC

## 2020-06-29 NOTE — Telephone Encounter (Signed)
Call made to patient, confirmed DOB. Confirmed medication and pharmacy. Refill sent. Voiced understanding.   Nothing further needed at this time.  

## 2020-11-02 ENCOUNTER — Ambulatory Visit (INDEPENDENT_AMBULATORY_CARE_PROVIDER_SITE_OTHER): Payer: Medicare Other | Admitting: Family Medicine

## 2020-11-02 ENCOUNTER — Encounter: Payer: Self-pay | Admitting: Family Medicine

## 2020-11-02 ENCOUNTER — Other Ambulatory Visit: Payer: Self-pay

## 2020-11-02 VITALS — BP 107/64 | HR 78 | Temp 97.7°F | Ht 59.65 in | Wt 141.4 lb

## 2020-11-02 DIAGNOSIS — S32000A Wedge compression fracture of unspecified lumbar vertebra, initial encounter for closed fracture: Secondary | ICD-10-CM

## 2020-11-02 DIAGNOSIS — I10 Essential (primary) hypertension: Secondary | ICD-10-CM | POA: Diagnosis not present

## 2020-11-02 DIAGNOSIS — M810 Age-related osteoporosis without current pathological fracture: Secondary | ICD-10-CM

## 2020-11-02 DIAGNOSIS — J432 Centrilobular emphysema: Secondary | ICD-10-CM | POA: Diagnosis not present

## 2020-11-02 DIAGNOSIS — E039 Hypothyroidism, unspecified: Secondary | ICD-10-CM | POA: Insufficient documentation

## 2020-11-02 NOTE — Patient Instructions (Signed)
Great to meet you today! We'll be in touch with lab results.  

## 2020-11-02 NOTE — Assessment & Plan Note (Signed)
S/p kyphoplasty.  Continues on lyrica for associated pain.  Stable a this time with current management.

## 2020-11-02 NOTE — Assessment & Plan Note (Signed)
Blood pressure is at goal at for age and co-morbidities.  I recommend continuation of current medications.  In addition they were instructed to follow a low sodium diet with regular exercise to help to maintain adequate control of blood pressure.  ? ?

## 2020-11-02 NOTE — Assessment & Plan Note (Signed)
She has quit smoking.  Management per pulmonology.  Stable symptoms.

## 2020-11-02 NOTE — Progress Notes (Signed)
Allison Zuniga - 80 y.o. female MRN [id number]  Date of birth: Mar 08, 1941  Subjective Chief Complaint  Patient presents with   Establish Care    HPI Allison Zuniga is a 80 y.o. female here today for initial visit to establish care.  She has  a history of HTN, COPD, osteoporosis with history of lumbar compression fracture and ankylosing spondylosis  HTN managed with irbesartan and atenolol.  Overall she is tolerating this well.  She denies side effects.  No chest pain, shortness of breath, palpitations, headache or vision changes.   Ankylosing spondylosis is currently managed by rheumatology.  Currently taking methotrexate with stable symptoms.    COPD managed with pulmonology.  Stable symptoms with trelegy daily and albuterol as needed.    She has seen osteoporosis clinic and reclast and prolia discussed.  She is leaning towards prolia.  She has seen Dr. Rolena Infante and is s/p kyphoplasty.  She continues on lyrica which works well for pain.    She feels like levothyroxine dose is working well for her but it has been a while since she had labs checked.    ROS:  A comprehensive ROS was completed and negative except as noted per HPI  Allergies  Allergen Reactions   Sulfa Antibiotics Other (See Comments)    Chest tightness   Aspirin Hives   Penicillins Hives    Past Medical History:  Diagnosis Date   COPD (chronic obstructive pulmonary disease) (HCC)    Former smoker    Hypertension    Hypothyroidism    Pneumonia    > 10 years ago per pt     Past Surgical History:  Procedure Laterality Date   EYE SURGERY     bilateral cataract removal 2021 per pt    KYPHOPLASTY N/A 09/24/2019   Procedure: THORACIC TWELVE, LUMBAR ONE KYPHOPLASTY;  Surgeon: Melina Schools, MD;  Location: [facility];  Service: Orthopedics;  Laterality: N/A;  31 ins    Social History   Socioeconomic History   Marital status: Married    Spouse name: Zykerria Tanton   Number of children: Not on file   Years of  education: Not on file   Highest education level: Not on file  Occupational History   Not on file  Tobacco Use   Smoking status: Former    Packs/day: 1.00    Years: 30.00    Pack years: 30.00    Types: Cigarettes    Quit date: 2015    Years since quitting: 7.4   Smokeless tobacco: Former  Scientific laboratory technician Use: Never used  Substance and Sexual Activity   Alcohol use: Yes    Alcohol/week: 1.0 - 2.0 standard drink    Types: 1 - 2 Standard drinks or equivalent per week   Drug use: Never   Sexual activity: Not Currently  Other Topics Concern   Not on file  Social History Narrative   Not on file   Social Determinants of Health   Financial Resource Strain: Not on file  Food Insecurity: Not on file  Transportation Needs: Not on file  Physical Activity: Not on file  Stress: Not on file  Social Connections: Not on file    Family History  Problem Relation Age of Onset   Hypertension Mother    Asthma Mother    Hypertension Father     Health Maintenance  Topic Date Due   Hepatitis C Screening  Never done   TETANUS/TDAP  Never done   Zoster Vaccines- Shingrix (  1 of 2) Never done   DEXA SCAN  Never done   PNA vac Low Risk Adult (2 of 2 - PPSV23) 04/25/2016   COVID-19 Vaccine (3 - Moderna risk series) 08/06/2019   INFLUENZA VACCINE  12/13/2020   HPV VACCINES  Aged Out     ----------------------------------------------------------------------------------------------------------------------------------------------------------------------------------------------------------------- Physical Exam BP 107/64 (BP Location: Left Arm, Patient Position: Sitting, Cuff Size: Small)   Pulse 78   Temp 97.7 F (36.5 C) (Oral)   Ht 4' 11.65" (1.515 m)   Wt 141 lb 6.4 oz (64.1 kg)   SpO2 95%   BMI 27.94 kg/m   Physical Exam Constitutional:      Appearance: Normal appearance.  HENT:     Head: Normocephalic and atraumatic.  Eyes:     General: No scleral  icterus. Cardiovascular:     Rate and Rhythm: Normal rate and regular rhythm.  Pulmonary:     Effort: Pulmonary effort is normal.     Breath sounds: Normal breath sounds.  Musculoskeletal:     Cervical back: Neck supple.  Skin:    General: Skin is warm and dry.  Neurological:     General: No focal deficit present.     Mental Status: She is alert.  Psychiatric:        Mood and Affect: Mood normal.    ------------------------------------------------------------------------------------------------------------------------------------------------------------------------------------------------------------------- Assessment and Plan  Essential hypertension Blood pressure is at goal at for age and co-morbidities.  I recommend continuation of current medications.  In addition they were instructed to follow a low sodium diet with regular exercise to help to maintain adequate control of blood pressure.    Chronic obstructive pulmonary disease, unspecified ([place]) She has quit smoking.  Management per pulmonology.  Stable symptoms.   Post-menopausal osteoporosis Management per osteoporosis clinic.  Leaning towards treatment with prolia.    Lumbar compression fracture (HCC) S/p kyphoplasty.  Continues on lyrica for associated pain.  Stable a this time with current management.    Hypothyroidism Update TSH   No orders of the defined types were placed in this encounter.   Return in about 6 months (around 05/04/2021) for HTN.    This visit occurred during the SARS-CoV-2 public health emergency.  Safety protocols were in place, including screening questions prior to the visit, additional usage of staff PPE, and extensive cleaning of exam room while observing appropriate contact time as indicated for disinfecting solutions.

## 2020-11-02 NOTE — Assessment & Plan Note (Signed)
Update TSH

## 2020-11-02 NOTE — Assessment & Plan Note (Signed)
Management per osteoporosis clinic.  Leaning towards treatment with prolia.

## 2020-11-03 LAB — TSH+FREE T4: TSH W/REFLEX TO FT4: 2.59 mIU/L (ref 0.40–4.50)

## 2020-11-03 IMAGING — CR DG LUMBAR SPINE 1V
1 series · 1 of 1 positions shown · non-contrast
Comparison: None.

CLINICAL DATA: Localization for kyphoplasty

EXAM:
LUMBAR SPINE - 1 VIEW

[xtable lateral]
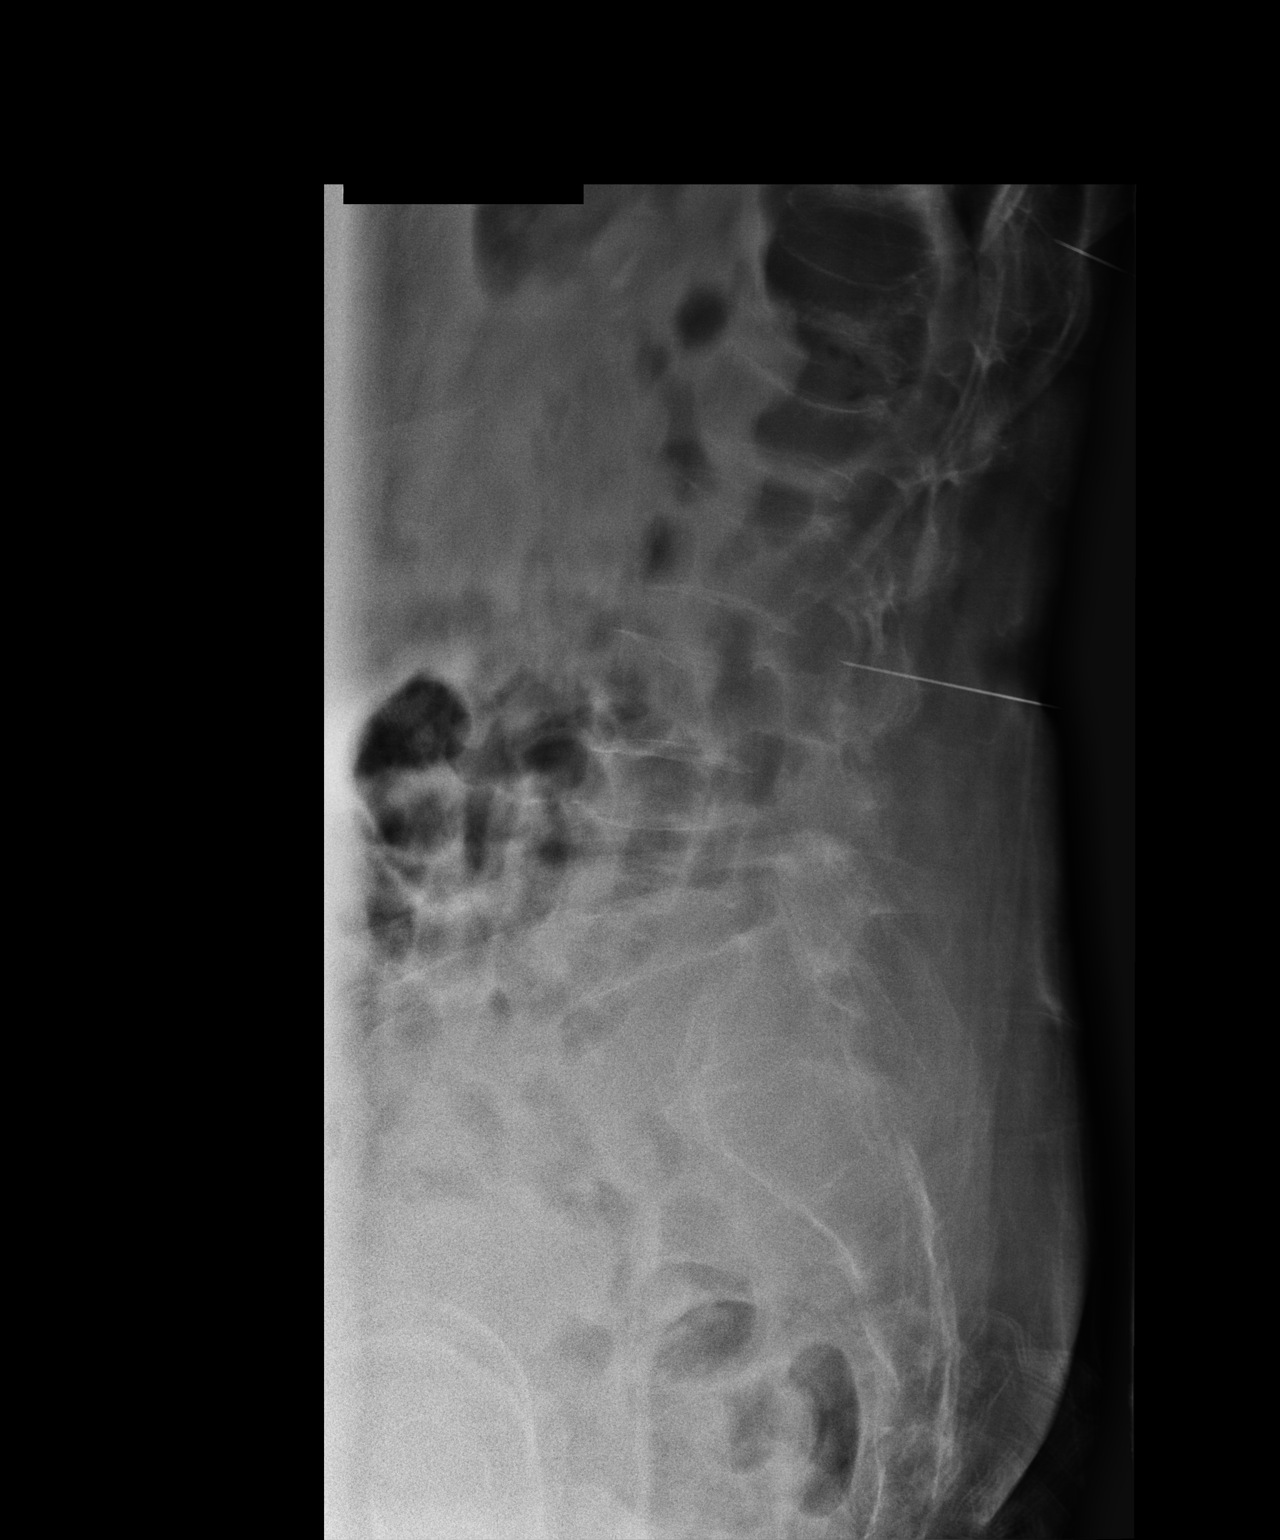

[1 of 1 positions shown; findings below may reference images not displayed]

FINDINGS: Lateral view of the lumbar spine was obtained during performance of
the procedure. Evaluation is limited due to portable technique and
obliquity. Surgical instrumentation is seen posterior to the T12
vertebral body and L3/L4 disc space.
IMPRESSION: 1. Limited evaluation as above, instrumentation posterior to the T12
vertebral body and L3/L4 disc space.

## 2020-12-08 ENCOUNTER — Other Ambulatory Visit: Payer: Self-pay

## 2020-12-08 ENCOUNTER — Encounter: Payer: Self-pay | Admitting: Family Medicine

## 2020-12-08 ENCOUNTER — Ambulatory Visit (INDEPENDENT_AMBULATORY_CARE_PROVIDER_SITE_OTHER): Payer: Medicare Other | Admitting: Family Medicine

## 2020-12-08 DIAGNOSIS — I10 Essential (primary) hypertension: Secondary | ICD-10-CM | POA: Diagnosis not present

## 2020-12-08 DIAGNOSIS — E039 Hypothyroidism, unspecified: Secondary | ICD-10-CM | POA: Diagnosis not present

## 2020-12-08 DIAGNOSIS — M810 Age-related osteoporosis without current pathological fracture: Secondary | ICD-10-CM | POA: Diagnosis not present

## 2020-12-08 DIAGNOSIS — S32000A Wedge compression fracture of unspecified lumbar vertebra, initial encounter for closed fracture: Secondary | ICD-10-CM

## 2020-12-08 MED ORDER — PREGABALIN 50 MG PO CAPS: 50.0000 mg | ORAL_CAPSULE | Freq: Every day | ORAL | 1 refills | Status: DC

## 2020-12-08 MED ORDER — SYNTHROID 75 MCG PO TABS: 75.0000 ug | ORAL_TABLET | Freq: Every day | ORAL | 1 refills | Status: DC

## 2020-12-08 NOTE — Progress Notes (Signed)
Allison Zuniga - 80 y.o. female MRN [id number]  Date of birth: December 04, 1940  Subjective Chief Complaint  Patient presents with   Hypertension   Hypothyroidism   Peripheral Neuropathy    HPI Allison Zuniga is a 80 year old female here today for follow-up of hypertension, hypothyroidism and neuropathy.  She continues to do well with combination of irbesartan and atenolol for management of hypertension.  She does feel a little bit sluggish at times feels like her blood pressure may be too low.  She denies any dizziness.  She has not had chest pain, shortness of breath, palpitations, headache or vision changes.  She feels good at current dose of levothyroxine.  Recent TSH was within normal limits.  She does take brand-name Synthroid.  She has been taking Lyrica for neuropathy associated with previous spine fracture.  She does have some tingling and sensation that her legs are asleep at times.  She denies weakness in her legs.  Lyrica is helpful she is also using an over-the-counter topical cream that has helped some as well.  ROS:  A comprehensive ROS was completed and negative except as noted per HPI   Allergies  Allergen Reactions   Sulfa Antibiotics Other (See Comments)    Chest tightness   Aspirin Hives   Penicillins Hives    Past Medical History:  Diagnosis Date   COPD (chronic obstructive pulmonary disease) (HCC)    Former smoker    Hypertension    Hypothyroidism    Pneumonia    > 10 years ago per pt     Past Surgical History:  Procedure Laterality Date   EYE SURGERY     bilateral cataract removal 2021 per pt    KYPHOPLASTY N/A 09/24/2019   Procedure: THORACIC TWELVE, LUMBAR ONE KYPHOPLASTY;  Surgeon: Melina Schools, MD;  Location: [facility];  Service: Orthopedics;  Laterality: N/A;  60 ins    Social History   Socioeconomic History   Marital status: Married    Spouse name: Ruth Slusher   Number of children: Not on file   Years of education: Not on file   Highest education  level: Not on file  Occupational History   Not on file  Tobacco Use   Smoking status: Former    Packs/day: 1.00    Years: 30.00    Pack years: 30.00    Types: Cigarettes    Quit date: 2015    Years since quitting: 7.5   Smokeless tobacco: Former  Scientific laboratory technician Use: Never used  Substance and Sexual Activity   Alcohol use: Yes    Alcohol/week: 1.0 - 2.0 standard drink    Types: 1 - 2 Standard drinks or equivalent per week   Drug use: Never   Sexual activity: Not Currently  Other Topics Concern   Not on file  Social History Narrative   Not on file   Social Determinants of Health   Financial Resource Strain: Not on file  Food Insecurity: Not on file  Transportation Needs: Not on file  Physical Activity: Not on file  Stress: Not on file  Social Connections: Not on file    Family History  Problem Relation Age of Onset   Hypertension Mother    Asthma Mother    Hypertension Father     Health Maintenance  Topic Date Due   Hepatitis C Screening  Never done   TETANUS/TDAP  Never done   Zoster Vaccines- Shingrix (1 of 2) Never done   DEXA SCAN  Never done  PNA vac Low Risk Adult (2 of 2 - PPSV23) 04/25/2016   COVID-19 Vaccine (3 - Moderna risk series) 08/06/2019   INFLUENZA VACCINE  12/13/2020   HPV VACCINES  Aged Out     ----------------------------------------------------------------------------------------------------------------------------------------------------------------------------------------------------------------- Physical Exam BP 133/77   Pulse 69   Temp (!) 97.3 F (36.3 C)   Ht 4' 11.75" (1.518 m)   Wt 143 lb (64.9 kg)   SpO2 97%   BMI 28.16 kg/m   Physical Exam Constitutional:      Appearance: Normal appearance.  HENT:     Head: Normocephalic and atraumatic.  Eyes:     General: No scleral icterus. Cardiovascular:     Rate and Rhythm: Normal rate and regular rhythm.  Pulmonary:     Effort: Pulmonary effort is normal.     Breath  sounds: Normal breath sounds.  Musculoskeletal:     Cervical back: Neck supple.  Neurological:     General: No focal deficit present.     Mental Status: She is alert.  Psychiatric:        Mood and Affect: Mood normal.        Behavior: Behavior normal.    ------------------------------------------------------------------------------------------------------------------------------------------------------------------------------------------------------------------- Assessment and Plan  Essential hypertension Blood pressure is well controlled with irbesartan and atenolol.  I gave her parameters for holding atenolol if needed.  Hypothyroidism Continue Synthroid at current strength.  Post-menopausal osteoporosis Currently doing Prolia treatments.  Lumbar compression fracture (HCC) History of lumbar compression fracture with associated neuropathy.  She will continue Lyrica at current strength.  We did discuss trying capsaicin cream to her lower extremities to help with neuropathy as well.   Meds ordered this encounter  Medications   pregabalin (LYRICA) 50 MG capsule    Sig: Take 1 capsule (50 mg total) by mouth daily.    Dispense:  90 capsule    Refill:  1   SYNTHROID 75 MCG tablet    Sig: Take 1 tablet (75 mcg total) by mouth daily before breakfast.    Dispense:  90 tablet    Refill:  1    Return in about 4 months (around 04/10/2021) for HTN/Hypothyroid.    This visit occurred during the SARS-CoV-2 public health emergency.  Safety protocols were in place, including screening questions prior to the visit, additional usage of staff PPE, and extensive cleaning of exam room while observing appropriate contact time as indicated for disinfecting solutions.

## 2020-12-08 NOTE — Assessment & Plan Note (Signed)
Currently doing Prolia treatments.

## 2020-12-08 NOTE — Assessment & Plan Note (Signed)
Continue Synthroid at current strength.

## 2020-12-08 NOTE — Assessment & Plan Note (Signed)
History of lumbar compression fracture with associated neuropathy.  She will continue Lyrica at current strength.  We did discuss trying capsaicin cream to her lower extremities to help with neuropathy as well.

## 2020-12-08 NOTE — Patient Instructions (Signed)
Great to see you today! Continue current medications. Follow up with me in about 4 months.

## 2020-12-08 NOTE — Assessment & Plan Note (Signed)
Blood pressure is well controlled with irbesartan and atenolol.  I gave her parameters for holding atenolol if needed.

## 2021-01-04 ENCOUNTER — Ambulatory Visit (INDEPENDENT_AMBULATORY_CARE_PROVIDER_SITE_OTHER): Payer: Medicare Other | Admitting: Family Medicine

## 2021-01-04 ENCOUNTER — Other Ambulatory Visit: Payer: Self-pay

## 2021-01-04 ENCOUNTER — Encounter: Payer: Self-pay | Admitting: Family Medicine

## 2021-01-04 VITALS — BP 146/68 | HR 68 | Temp 98.6°F | Resp 18

## 2021-01-04 DIAGNOSIS — B351 Tinea unguium: Secondary | ICD-10-CM

## 2021-01-04 MED ORDER — CICLOPIROX 8 % EX SOLN: Freq: Every day | CUTANEOUS | 0 refills | Status: DC

## 2021-01-04 NOTE — Progress Notes (Signed)
Acute Office Visit  Subjective:    Patient ID: Allison Zuniga, female    DOB: [birth year], 80 y.o.   MRN: [id number]  Chief Complaint  Patient presents with   Nail Problem    HPI Patient is in today for toe fungus.  Patient states a little over a month ago she noticed some nail changes to her right great toe. Reports she has never had toe fungus before and gets regular pedicures in an attempt to keep her feet healthy. She noticed one day the toe was slightly painful when she put some shoes, but has not since had any more discomfort. States there were some black spots under the nail that she was able to clean out, but now the nail has a yellowish-tint, seems to be raising up and thickening more than usual. She denies any redness, swelling, cuts, sores, pain, etc.    Past Medical History:  Diagnosis Date   COPD (chronic obstructive pulmonary disease) ([place])    Former smoker    Hypertension    Hypothyroidism    Pneumonia    > 10 years ago per pt     Past Surgical History:  Procedure Laterality Date   EYE SURGERY     bilateral cataract removal 2021 per pt    KYPHOPLASTY N/A 09/24/2019   Procedure: THORACIC TWELVE, LUMBAR ONE KYPHOPLASTY;  Surgeon: Melina Schools, MD;  Location: [facility];  Service: Orthopedics;  Laterality: N/A;  62 ins    Family History  Problem Relation Age of Onset   Hypertension Mother    Asthma Mother    Hypertension Father     Social History   Socioeconomic History   Marital status: Married    Spouse name: Jaydence Montano   Number of children: Not on file   Years of education: Not on file   Highest education level: Not on file  Occupational History   Not on file  Tobacco Use   Smoking status: Former    Packs/day: 1.00    Years: 30.00    Pack years: 30.00    Types: Cigarettes    Quit date: 2015    Years since quitting: 7.6   Smokeless tobacco: Former  Scientific laboratory technician Use: Never used  Substance and Sexual Activity   Alcohol use: Yes     Alcohol/week: 1.0 - 2.0 standard drink    Types: 1 - 2 Standard drinks or equivalent per week   Drug use: Never   Sexual activity: Not Currently  Other Topics Concern   Not on file  Social History Narrative   Not on file   Social Determinants of Health   Financial Resource Strain: Not on file  Food Insecurity: Not on file  Transportation Needs: Not on file  Physical Activity: Not on file  Stress: Not on file  Social Connections: Not on file  Intimate Partner Violence: Not on file    Outpatient Medications Prior to Visit  Medication Sig Dispense Refill   albuterol (VENTOLIN HFA) 108 (90 Base) MCG/ACT inhaler Inhale into the lungs every 6 (six) hours as needed for wheezing or shortness of breath.     atenolol (TENORMIN) 50 MG tablet Take 50 mg by mouth 2 (two) times daily.      cyanocobalamin 100 MCG tablet Take 1,000 mcg by mouth daily.     denosumab (PROLIA) 60 MG/ML SOSY injection Inject 60 mg into the skin every 6 (six) months.     Fluticasone-Umeclidin-Vilant (TRELEGY ELLIPTA) 100-62.5-25 MCG/INH AEPB Inhale  1 puff into the lungs daily. 1 each 6   folic acid (FOLVITE) 1 MG tablet Take 2 mg by mouth daily.     irbesartan (AVAPRO) 150 MG tablet Take 150 mg by mouth daily.     methotrexate 2.5 MG tablet Take 15 mg by mouth every Monday.     OVER THE COUNTER MEDICATION Take 500 mg by mouth daily. Citracel D3     pregabalin (LYRICA) 50 MG capsule Take 1 capsule (50 mg total) by mouth daily. 90 capsule 1   PREVIDENT 1.1 % GEL dental gel Place 1 application onto teeth in the morning and at bedtime.     SYNTHROID 75 MCG tablet Take 1 tablet (75 mcg total) by mouth daily before breakfast. 90 tablet 1   No facility-administered medications prior to visit.    Allergies  Allergen Reactions   Sulfa Antibiotics Other (See Comments)    Chest tightness   Aspirin Hives   Penicillins Hives    Review of Systems All review of systems negative except what is listed in the HPI      Objective:    Physical Exam Vitals reviewed.  Constitutional:      Appearance: Normal appearance. She is normal weight.  Musculoskeletal:        General: Normal range of motion.  Skin:    General: Skin is warm and dry.     Capillary Refill: Capillary refill takes less than 2 seconds.     Findings: No erythema, lesion or rash.     Comments: R great toe per picture below  Neurological:     General: No focal deficit present.     Mental Status: She is alert and oriented to person, place, and time. Mental status is at baseline.  Psychiatric:        Mood and Affect: Mood normal.        Behavior: Behavior normal.        Thought Content: Thought content normal.        Judgment: Judgment normal.          BP (!) 146/68   Pulse 68   Temp 98.6 F (37 C)   Resp 18   SpO2 95%  Wt Readings from Last 3 Encounters:  12/08/20 143 lb (64.9 kg)  11/02/20 141 lb 6.4 oz (64.1 kg)  04/28/20 134 lb 4 oz (60.9 kg)    Health Maintenance Due  Topic Date Due   Hepatitis C Screening  Never done   TETANUS/TDAP  Never done   Zoster Vaccines- Shingrix (1 of 2) Never done   DEXA SCAN  Never done   PNA vac Low Risk Adult (2 of 2 - PPSV23) 04/25/2016   COVID-19 Vaccine (3 - Moderna risk series) 08/06/2019   INFLUENZA VACCINE  12/13/2020    There are no preventive care reminders to display for this patient.   No results found for: TSH Lab Results  Component Value Date   WBC 6.4 09/30/2019   HGB 14.3 09/30/2019   HCT 44.2 09/30/2019   MCV 92.5 09/30/2019   PLT 307 09/30/2019   Lab Results  Component Value Date   NA 136 09/30/2019   K 3.8 09/30/2019   CO2 29 09/30/2019   GLUCOSE 103 (H) 09/30/2019   BUN 9 09/30/2019   CREATININE 0.63 09/30/2019   BILITOT 0.7 09/30/2019   ALKPHOS 106 09/30/2019   AST 19 09/30/2019   ALT 17 09/30/2019   PROT 7.6 09/30/2019   ALBUMIN 3.7 09/30/2019  CALCIUM 9.2 09/30/2019   ANIONGAP 12 09/30/2019   No results found for: CHOL No results  found for: HDL No results found for: LDLCALC No results found for: TRIG No results found for: CHOLHDL No results found for: HGBA1C     Assessment & Plan:   1. Onychomycosis of right great toe Patient would like to try topical therapy for her toe. Prescription sent in. Patient aware that this can take awhile to notice improvement - she is not having any symptoms with this, but would more so like it resolved for cosmetic reasons. Discussed nail care. Patient verbalized understanding.   Patient aware of signs/symptoms requiring further/urgent evaluation.  - ciclopirox (PENLAC) 8 % solution; Apply topically at bedtime. Apply over nail and surrounding skin. Apply daily over previous coat. After seven (7) days, may remove with alcohol and continue cycle.  Dispense: 6.6 mL; Refill: 0  Follow-up as needed.   [facility] Olevia Bowens, DNP, FNP-C

## 2021-01-10 ENCOUNTER — Other Ambulatory Visit: Payer: Self-pay

## 2021-01-10 MED ORDER — ATENOLOL 50 MG PO TABS: 50.0000 mg | ORAL_TABLET | Freq: Two times a day (BID) | ORAL | 1 refills | Status: DC

## 2021-01-20 ENCOUNTER — Other Ambulatory Visit: Payer: Self-pay

## 2021-01-20 ENCOUNTER — Ambulatory Visit (INDEPENDENT_AMBULATORY_CARE_PROVIDER_SITE_OTHER): Payer: Medicare Other | Admitting: Cardiology

## 2021-01-20 ENCOUNTER — Encounter (HOSPITAL_BASED_OUTPATIENT_CLINIC_OR_DEPARTMENT_OTHER): Payer: Self-pay | Admitting: Cardiology

## 2021-01-20 VITALS — BP 128/76 | HR 64 | Ht 59.75 in | Wt 144.8 lb

## 2021-01-20 DIAGNOSIS — R0602 Shortness of breath: Secondary | ICD-10-CM

## 2021-01-20 DIAGNOSIS — I10 Essential (primary) hypertension: Secondary | ICD-10-CM

## 2021-01-20 DIAGNOSIS — I351 Nonrheumatic aortic (valve) insufficiency: Secondary | ICD-10-CM | POA: Diagnosis not present

## 2021-01-20 DIAGNOSIS — Z7189 Other specified counseling: Secondary | ICD-10-CM

## 2021-01-20 DIAGNOSIS — R931 Abnormal findings on diagnostic imaging of heart and coronary circulation: Secondary | ICD-10-CM

## 2021-01-20 NOTE — Patient Instructions (Signed)

## 2021-01-20 NOTE — Progress Notes (Signed)
Cardiology Office Note:    Date:  01/20/2021   ID:  Zakyah Yanes, DOB 1941-01-31, MRN [id number]  PCP:  Luetta Nutting, DO  Cardiologist:  Buford Dresser, MD  Referring MD: Mosetta Anis, MD   CC: follow up  History of Present Illness:    Allison Zuniga is a 80 y.o. female with a hx of hypertension, COPD, prior tobacco use who is seen for follow up today.  I initially met her 03/14/2019 as a new consult at the request of Mosetta Anis, MD for the evaluation and management of shortness of breath, mild AI, EF 50% on prior echo.  Today: Doing well overall. Cut back on atenolol, taking at night. Takes irbesartan in the morning. Blood pressure was low and now improved.   Shortness of breath resolved, no COPD flares.   On methotrexate for rheumatoid arthritis. On prolia for osteoporosis. On trelegy for COPD.   Denies chest pain, shortness of breath at rest or with normal exertion. No PND, orthopnea, LE edema or unexpected weight gain. No syncope or palpitations.   Past Medical History:  Diagnosis Date   COPD (chronic obstructive pulmonary disease) ([place])    Former smoker    Hypertension    Hypothyroidism    Pneumonia    > 10 years ago per pt     Past Surgical History:  Procedure Laterality Date   EYE SURGERY     bilateral cataract removal 2021 per pt    KYPHOPLASTY N/A 09/24/2019   Procedure: THORACIC TWELVE, LUMBAR ONE KYPHOPLASTY;  Surgeon: Melina Schools, MD;  Location: [facility];  Service: Orthopedics;  Laterality: N/A;  60 ins    Current Medications: Current Outpatient Medications on File Prior to Visit  Medication Sig   albuterol (VENTOLIN HFA) 108 (90 Base) MCG/ACT inhaler Inhale into the lungs every 6 (six) hours as needed for wheezing or shortness of breath.   atenolol (TENORMIN) 50 MG tablet Take 1 tablet (50 mg total) by mouth 2 (two) times daily.   ciclopirox (PENLAC) 8 % solution Apply topically at bedtime. Apply over nail and surrounding skin. Apply daily  over previous coat. After seven (7) days, may remove with alcohol and continue cycle.   cyanocobalamin 100 MCG tablet Take 1,000 mcg by mouth daily.   denosumab (PROLIA) 60 MG/ML SOSY injection Inject 60 mg into the skin every 6 (six) months.   Fluticasone-Umeclidin-Vilant (TRELEGY ELLIPTA) 100-62.5-25 MCG/INH AEPB Inhale 1 puff into the lungs daily.   folic acid (FOLVITE) 1 MG tablet Take 2 mg by mouth daily.   irbesartan (AVAPRO) 150 MG tablet Take 150 mg by mouth daily.   methotrexate 2.5 MG tablet Take 15 mg by mouth every Monday.   OVER THE COUNTER MEDICATION Take 500 mg by mouth daily. Citracel D3   pregabalin (LYRICA) 50 MG capsule Take 1 capsule (50 mg total) by mouth daily.   PREVIDENT 1.1 % GEL dental gel Place 1 application onto teeth in the morning and at bedtime.   SYNTHROID 75 MCG tablet Take 1 tablet (75 mcg total) by mouth daily before breakfast.   No current facility-administered medications on file prior to visit.     Allergies:   Sulfa antibiotics, Aspirin, and Penicillins   Social History   Tobacco Use   Smoking status: Former    Packs/day: 1.00    Years: 30.00    Pack years: 30.00    Types: Cigarettes    Quit date: 2015    Years since quitting: 7.6   Smokeless tobacco:  Former  Scientific laboratory technician Use: Never used  Substance Use Topics   Alcohol use: Yes    Alcohol/week: 1.0 - 2.0 standard drink    Types: 1 - 2 Standard drinks or equivalent per week   Drug use: Never    Family History: family history includes Asthma in her mother; Hypertension in her father and mother.  ROS:   Please see the history of present illness.  Additional pertinent ROS otherwise unremarkable.  EKGs/Labs/Other Studies Reviewed:    The following studies were reviewed today: Echo 01/14/2019 (report only, I cannot see images): 2D Findings  Aortic Root: Normal  Aortic Leaflets: Normal  Leaflet mobility: Normal  Left Ventricle Size: Normal  Systolic Function: mildly dec reased  with EF estimated 50% Wall Thickness: Normal  Mitral Valve: Normal  Left Atrium: Normal  Right Ventricle Size: Normal  Right Atrium: Normal  Tricuspid Valve: Normal Pulmonic Valve: Normal Atrial Septum: Normal Ventricular Septum: Normal Pericardium: Normal Pericardial effusion: None Intracardiac mass/thrombus: None Endocardial vegetations: None  Doppler  LVOT Peak Velocity: 0.98 m/sec Gradient: 3.8 mmHg  Aortic Valve Insufficiency: mild PHT: 483 m/sec Peak Velocity: 1.32 m/sec Peak Gradient: 7.0 mmHg Mean Gradient: 3.2 mmHg Ao VTI: 24 cm Ao Valve Area: 2.72 cm2 CO: 4.3 L/min SV: 65 mL  Pulmonic Valve Insufficiency: no Peak Gradient: 1.0 mmHg  Mitral Valve Regurgitation: no MV Area: 3.28 cm2 PHT: 67 msec E Vel: 0.62 m/sec A Vel: 0.62 m/sec E/A Ratio: 1.0  Diastolic Pattern: normal  Tricuspid Valve Regurgitation: no TR Max Velocity: n/a m/s IVC less than 2.1 cm with greater than 50 % collapse Estimated RA Pressure: 10 mmHg Estimated PA Pressure (RVESP): 35 mmHg  Imaging Windows: Parasternal: fair Apical: fair Subcostal: fair  IMPRESSION/SUMMARY:  Mild aortic insufficiency Mildly decreased EF  EKG:  EKG is personally reviewed.   01/20/21 NSR at 64 bpm 03/14/19 NSR  Recent Labs: No results found for requested labs within last 8760 hours.  Recent Lipid Panel No results found for: CHOL, TRIG, HDL, CHOLHDL, VLDL, LDLCALC, LDLDIRECT  Physical Exam:    VS:  BP 128/76   Pulse 64   Ht 4' 11.75" (1.518 m)   Wt 144 lb 12.8 oz (65.7 kg)   SpO2 97%   BMI 28.52 kg/m     Wt Readings from Last 3 Encounters:  01/20/21 144 lb 12.8 oz (65.7 kg)  12/08/20 143 lb (64.9 kg)  11/02/20 141 lb 6.4 oz (64.1 kg)    GEN: Well nourished, well developed in no acute distress HEENT: Normal, moist mucous membranes NECK: No JVD CARDIAC: regular rhythm, normal S1 and S2, no rubs or gallops. No murmur. VASCULAR: Radial and DP pulses 2+ bilaterally. No carotid  bruits RESPIRATORY:  Clear to auscultation without rales, wheezing or rhonchi  ABDOMEN: Soft, non-tender, non-distended MUSCULOSKELETAL:  Ambulates independently SKIN: Warm and dry, no edema NEUROLOGIC:  Alert and oriented x 3. No focal neuro deficits noted. PSYCHIATRIC:  Normal affect     ASSESSMENT:    1. Essential hypertension   2. SOB (shortness of breath)   3. Nonrheumatic aortic valve insufficiency   4. Abnormal echocardiogram   5. Cardiac risk counseling     PLAN:    Shortness of breath: suspect predominantly pulmonary in origin. -management of COPD per Dr. Valeta Harms -euvolemic on exam -no suggestion of diastolic dysfunction on echo -mild systolic dysfunction, though no comment on focal wall motion abnormalities.  -discussed red flag warning signs that need immediate medical attention -aortic regurgitation  only mild -ECG unchanged today  Hypertension:  -at goal of <130/80 today -continue irbesartan -no cardiac necessity for beta blocker, if needed could wean atenolol (esp given COPD) and use chlorthalidone or amlodipine instead -she feels well on current regimen, will monitor her symptoms  Cardiac risk counseling and prevention recommendations: -recommend heart healthy/Mediterranean diet, with whole grains, fruits, vegetable, fish, lean meats, nuts, and olive oil. Limit salt. -recommend moderate walking, 3-5 times/week for 30-50 minutes each session. Aim for at least 150 minutes.week. Goal should be pace of 3 miles/hours, or walking 1.5 miles in 30 minutes -recommend avoidance of tobacco products. Avoid excess alcohol. -no known ASCVD, and she is beyond age guidelines for lipid management.   Plan for follow up: 2 years or sooner PRN  Medication Adjustments/Labs and Tests Ordered: Current medicines are reviewed at length with the patient today.  Concerns regarding medicines are outlined above.  Orders Placed This Encounter  Procedures   EKG 12-Lead    No orders of  the defined types were placed in this encounter.   Patient Instructions  Medication Instructions:  Your Physician recommend you continue on your current medication as directed.    *If you need a refill on your cardiac medications before your next appointment, please call your pharmacy*   Lab Work: None ordered today   Testing/Procedures: None ordered today   Follow-Up: At [facility], you and your health needs are our priority.  As part of our continuing mission to provide you with exceptional heart care, we have created designated Provider Care Teams.  These Care Teams include your primary Cardiologist (physician) and Advanced Practice Providers (APPs -  Physician Assistants and Nurse Practitioners) who all work together to provide you with the care you need, when you need it.  We recommend signing up for the patient portal called "MyChart".  Sign up information is provided on this After Visit Summary.  MyChart is used to connect with patients for Virtual Visits (Telemedicine).  Patients are able to view lab/test results, encounter notes, upcoming appointments, etc.  Non-urgent messages can be sent to your provider as well.   To learn more about what you can do with MyChart, go to [email or web address].    Your next appointment:   2 year(s)  The format for your next appointment:   In Person  Provider:   Buford Dresser, MD   Signed, Buford Dresser, MD PhD 01/20/2021     [place]

## 2021-01-24 ENCOUNTER — Telehealth: Payer: Medicare Other | Admitting: Family Medicine

## 2021-01-24 ENCOUNTER — Other Ambulatory Visit: Payer: Self-pay | Admitting: Acute Care

## 2021-01-24 ENCOUNTER — Telehealth: Payer: Self-pay | Admitting: Pulmonary Disease

## 2021-01-24 DIAGNOSIS — U071 COVID-19: Secondary | ICD-10-CM

## 2021-01-24 MED ORDER — MOLNUPIRAVIR EUA 200MG CAPSULE: 4.0000 | ORAL_CAPSULE | Freq: Two times a day (BID) | ORAL | 0 refills | Status: AC

## 2021-01-24 NOTE — Telephone Encounter (Signed)
Spoke with the pt and notified of response per Judson Roch  She verbalized understanding  Appt with SG for 02/16/21

## 2021-01-24 NOTE — Telephone Encounter (Signed)
Primary Pulmonologist: Dr. Valeta Harms Last office visit and with whom: 04/28/20- Dr. Valeta Harms What do we see them for (pulmonary problems): lung nodule, COPD Last OV assessment/plan:   Instructions   Return in about 1 year (around 04/28/2021) for Dr. Valeta Harms . Thank you for visiting Dr. Valeta Harms at [facility]. Today we recommend the following:      Orders Placed This Encounter  Procedures   CT Super D Chest Wo Contrast    Trelegy 173mg Samples, and instruction    Return in about 1 year (around 04/28/2021) for Dr. IValeta Harms.        Reason for call:  Called and spoke with Patient.  Patient stated she started feeling like she was starting cold symptoms yesterday.  Patient stated she had been around her brother in law who tested positive for covid Friday.  Patient stated she took a home covid test this morning and it was positive. Patient has taken Tylenol for body aches.  Patient stated she is having a non productive cough, head ache, and a scratchy, sore throat. Patient was concerned with Covid getting worse and wanted recommendations.  Patient stated prescription or OTC recommendations were fine, if that is what is best treatment. Patient stated she is vaccinated and has had 2 covid boosters. [facility]  Message routed to SJudson Roch NP Dr IValeta Harmsis unavailable at this time     Allergies  Allergen Reactions   Sulfa Antibiotics Other (See Comments)    Chest tightness   Aspirin Hives   Penicillins Hives    Immunization History  Administered Date(s) Administered   DT (Pediatric) 12/28/2003   Influenza Whole 03/30/2009, 02/22/2010, 03/07/2011, 02/29/2012, 02/28/2013   Influenza, High Dose Seasonal PF 03/11/2014, 02/25/2019, 02/25/2020   Influenza,inj,Quad PF,6+ Mos 03/11/2015, 03/02/2016, 03/07/2017   Influenza-Unspecified 04/25/1999, 04/23/2000, 04/02/2001, 04/02/2002, 04/14/2005, 03/21/2006, 03/14/2007   Moderna Sars-Covid-2  Vaccination 06/11/2019, 07/09/2019   Pneumococcal Conjugate-13 04/26/2015

## 2021-01-24 NOTE — Telephone Encounter (Signed)
PCCM:  I called and spoke with the patient regarding her recent diagnosis. I was contacted by the patients daughter who has questions.   Patient is anxious about taking the medication for covid. She is however symptomatic. We discussed the pros and cons.   She was thankful for the call and checking in on her.    Garner Nash, DO [facility] 01/24/2021 7:52 PM

## 2021-02-16 ENCOUNTER — Ambulatory Visit: Payer: Medicare Other | Admitting: Acute Care

## 2021-02-21 ENCOUNTER — Other Ambulatory Visit: Payer: Self-pay

## 2021-02-21 ENCOUNTER — Encounter: Payer: Self-pay | Admitting: Adult Health

## 2021-02-21 ENCOUNTER — Ambulatory Visit (INDEPENDENT_AMBULATORY_CARE_PROVIDER_SITE_OTHER): Payer: Medicare Other | Admitting: Adult Health

## 2021-02-21 ENCOUNTER — Other Ambulatory Visit: Payer: Self-pay | Admitting: *Deleted

## 2021-02-21 VITALS — BP 124/78 | HR 65 | Temp 98.4°F | Ht 59.0 in | Wt 145.0 lb

## 2021-02-21 DIAGNOSIS — J432 Centrilobular emphysema: Secondary | ICD-10-CM

## 2021-02-21 DIAGNOSIS — Z23 Encounter for immunization: Secondary | ICD-10-CM | POA: Diagnosis not present

## 2021-02-21 DIAGNOSIS — R911 Solitary pulmonary nodule: Secondary | ICD-10-CM | POA: Diagnosis not present

## 2021-02-21 DIAGNOSIS — U071 COVID-19: Secondary | ICD-10-CM | POA: Diagnosis not present

## 2021-02-21 MED ORDER — ALBUTEROL SULFATE HFA 108 (90 BASE) MCG/ACT IN AERS: 1.0000 | INHALATION_SPRAY | Freq: Four times a day (QID) | RESPIRATORY_TRACT | 5 refills | Status: DC | PRN

## 2021-02-21 MED ORDER — TRELEGY ELLIPTA 100-62.5-25 MCG/INH IN AEPB: 1.0000 | INHALATION_SPRAY | Freq: Every day | RESPIRATORY_TRACT | 0 refills | Status: DC

## 2021-02-21 NOTE — Patient Instructions (Addendum)
Claritin 10mg  daily for 1 week and then As needed   Delsym 2 tsp Twice daily  As needed  cough  Continue on TRELEGY 1 puff daily, rinse after use  CT in December as planned .  Flu shot .  Follow up with Dr. Valeta Harms in 2 months with PFTs,  Please contact office for sooner follow up if symptoms do not improve or worsen or seek emergency care

## 2021-02-21 NOTE — Assessment & Plan Note (Signed)
8 mm right lower lobe nodule.  Has been stable on serial CT chest.  CT chest December 2022 is pending.

## 2021-02-21 NOTE — Assessment & Plan Note (Signed)
Stable-continue on current regimen. PFTs on return. Continue on Trelegy.  Plan  Patient Instructions  Claritin 10mg  daily for 1 week and then As needed   Delsym 2 tsp Twice daily  As needed  cough  Continue on TRELEGY 1 puff daily, rinse after use  CT in December as planned .  Flu shot .  Follow up with Dr. Valeta Harms in 2 months with PFTs,  Please contact office for sooner follow up if symptoms do not improve or worsen or seek emergency care

## 2021-02-21 NOTE — Assessment & Plan Note (Signed)
COVID-19 infection 1 month ago.  Patient did well with 5-day antiviral pack.  She has some lingering post viral cough.  She is to use Claritin and Delsym as needed.  Plan  Patient Instructions  Claritin 10mg  daily for 1 week and then As needed   Delsym 2 tsp Twice daily  As needed  cough  Continue on TRELEGY 1 puff daily, rinse after use  CT in December as planned .  Flu shot .  Follow up with Dr. Valeta Harms in 2 months with PFTs,  Please contact office for sooner follow up if symptoms do not improve or worsen or seek emergency care

## 2021-02-21 NOTE — Progress Notes (Signed)
@Patient  ID: Allison Zuniga, female    DOB: 07/15/1940, 80 y.o.   MRN: [id number]  Chief Complaint  Patient presents with   Follow-up    Some coughing    Referring provider: Luetta Nutting, DO  HPI: 80 yo female former smoker followed for COPD , Bronchiectasis , Lung nodule  From [place] .  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  Some areas of mild bronchiectasis. Overall lung nodules are stable.  CT chest December 2021 showed stable 8 mm nodule in the right lower lobe.  Previous left upper lobe lung nodule not visualized.,  Increased opacity on the right upper lobe concerning for atelectasis.  Bronchiectasis and scarring in the lingula  02/21/2021 Follow up : COPD, Bronchiectasis, Covid 19  Patient returns for follow-up visit.  Patient has underlying COPD and bronchiectasis.  She recently had COVID-19 infection last month.  Patient says she had cold like symptoms that were mild in nature.  She was prescribed Molnupiravir .  She says she has had some lingering cough that has been improving each day.  Has some postnasal drip and throat clearing.  She denies any fever, discolored mucus, chest pain, orthopnea or dyspnea.  Patient is followed for COPD.  She is on Trelegy inhaler daily.  She does complain that Trelegy is very expensive.  We discussed checking into her formulary for more cost effective inhalers.  Patient has known pulmonary nodules last CT chest was stable December 2021.  She has a follow-up CT in December plan.     Allergies  Allergen Reactions   Sulfa Antibiotics Other (See Comments)    Chest tightness   Aspirin Hives   Penicillins Hives    Immunization History  Administered Date(s) Administered   DT (Pediatric) 12/28/2003   Influenza Whole 03/30/2009, 02/22/2010, 03/07/2011,  02/29/2012, 02/28/2013   Influenza, High Dose Seasonal PF 03/11/2014, 02/25/2019, 02/25/2020   Influenza,inj,Quad PF,6+ Mos 03/11/2015, 03/02/2016, 03/07/2017   Influenza-Unspecified 04/25/1999, 04/23/2000, 04/02/2001, 04/02/2002, 04/14/2005, 03/21/2006, 03/14/2007   Moderna Sars-Covid-2 Vaccination 06/11/2019, 07/09/2019   Pneumococcal Conjugate-13 04/26/2015    Past Medical History:  Diagnosis Date   COPD (chronic obstructive pulmonary disease) ([place])    Former smoker    Hypertension    Hypothyroidism    Pneumonia    > 10 years ago per pt     Tobacco History: Social History   Tobacco Use  Smoking Status Former   Packs/day: 1.00   Years: 30.00   Pack years: 30.00   Types: Cigarettes   Quit date: 2015   Years since quitting: 7.7  Smokeless Tobacco Former   Counseling given: Not Answered   Outpatient Medications Prior to Visit  Medication Sig Dispense Refill   albuterol (VENTOLIN HFA) 108 (90 Base) MCG/ACT inhaler Inhale into the lungs every 6 (six) hours as needed for wheezing or shortness of breath.     atenolol (TENORMIN) 50 MG tablet Take 50 mg by mouth at bedtime.     ciclopirox (PENLAC) 8 % solution Apply topically at bedtime. Apply over nail and surrounding skin. Apply daily over previous coat. After seven (7) days, may remove with alcohol and continue cycle. 6.6 mL 0   cyanocobalamin 100 MCG tablet Take 1,000 mcg by mouth daily.     denosumab (PROLIA) 60 MG/ML SOSY injection Inject 60 mg into the skin every 6 (six) months.  Fluticasone-Umeclidin-Vilant (TRELEGY ELLIPTA) 100-62.5-25 MCG/INH AEPB Inhale 1 puff into the lungs daily. 1 each 6   folic acid (FOLVITE) 1 MG tablet Take 2 mg by mouth daily.     irbesartan (AVAPRO) 150 MG tablet Take 150 mg by mouth daily.     methotrexate 2.5 MG tablet Take 15 mg by mouth every Monday.     OVER THE COUNTER MEDICATION Take 500 mg by mouth 2 (two) times daily. Citracel D3     pregabalin (LYRICA) 50 MG capsule Take 1  capsule (50 mg total) by mouth daily. 90 capsule 1   PREVIDENT 1.1 % GEL dental gel Place 1 application onto teeth in the morning and at bedtime.     SYNTHROID 75 MCG tablet Take 1 tablet (75 mcg total) by mouth daily before breakfast. 90 tablet 1   No facility-administered medications prior to visit.     Review of Systems:   Constitutional:   No  weight loss, night sweats,  Fevers, chills, fatigue, or  lassitude.  HEENT:   No headaches,  Difficulty swallowing,  Tooth/dental problems, or  Sore throat,                No sneezing, itching, ear ache,  +nasal congestion, post nasal drip,   CV:  No chest pain,  Orthopnea, PND, swelling in lower extremities, anasarca, dizziness, palpitations, syncope.   GI  No heartburn, indigestion, abdominal pain, nausea, vomiting, diarrhea, change in bowel habits, loss of appetite, bloody stools.   Resp:  No change in color of mucus.  No wheezing.  No chest wall deformity  Skin: no rash or lesions.  GU: no dysuria, change in color of urine, no urgency or frequency.  No flank pain, no hematuria   MS:  No joint pain or swelling.  No decreased range of motion.  No back pain.    Physical Exam  BP 124/78 (BP Location: Left Arm, Patient Position: Sitting, Cuff Size: Normal)   Pulse 65   Temp 98.4 F (36.9 C) (Oral)   Ht 4\' 11"  (1.499 m)   Wt 145 lb (65.8 kg)   SpO2 97%   BMI 29.29 kg/m   GEN: A/Ox3; pleasant , NAD, well nourished    HEENT:  [place]/AT,   NOSE-clear, THROAT-clear, no lesions, no postnasal drip or exudate noted.   NECK:  Supple w/ fair ROM; no JVD; normal carotid impulses w/o bruits; no thyromegaly or nodules palpated; no lymphadenopathy.    RESP  Clear  P & A; w/o, wheezes/ rales/ or rhonchi. no accessory muscle use, no dullness to percussion  CARD:  RRR, no m/r/g, no peripheral edema, pulses intact, no cyanosis or clubbing.  GI:   Soft & nt; nml bowel sounds; no organomegaly or masses detected.   Musco: Warm bil, no  deformities or joint swelling noted.   Neuro: alert, no focal deficits noted.    Skin: Warm, no lesions or rashes    Lab Results:  CBC    Component Value Date/Time   WBC 6.4 09/30/2019 1343   RBC 4.78 09/30/2019 1343   HGB 14.3 09/30/2019 1343   HCT 44.2 09/30/2019 1343   PLT 307 09/30/2019 1343   MCV 92.5 09/30/2019 1343   MCH 29.9 09/30/2019 1343   MCHC 32.4 09/30/2019 1343   RDW 13.8 09/30/2019 1343    BMET    Component Value Date/Time   NA 136 09/30/2019 1343   K 3.8 09/30/2019 1343   CL 95 (L) 09/30/2019 1343  CO2 29 09/30/2019 1343   GLUCOSE 103 (H) 09/30/2019 1343   BUN 9 09/30/2019 1343   CREATININE 0.63 09/30/2019 1343   CALCIUM 9.2 09/30/2019 1343   GFRNONAA >60 09/30/2019 1343   GFRAA >60 09/30/2019 1343    BNP No results found for: BNP  ProBNP No results found for: PROBNP  Imaging: No results found.    No flowsheet data found.  No results found for: NITRICOXIDE      Assessment & Plan:   Chronic obstructive pulmonary disease, unspecified ([place]) Stable-continue on current regimen. PFTs on return. Continue on Trelegy.  Plan  Patient Instructions  Claritin 10mg  daily for 1 week and then As needed   Delsym 2 tsp Twice daily  As needed  cough  Continue on TRELEGY 1 puff daily, rinse after use  CT in December as planned .  Flu shot .  Follow up with Dr. Valeta Harms in 2 months with PFTs,  Please contact office for sooner follow up if symptoms do not improve or worsen or seek emergency care       COVID-19 virus infection COVID-19 infection 1 month ago.  Patient did well with 5-day antiviral pack.  She has some lingering post viral cough.  She is to use Claritin and Delsym as needed.  Plan  Patient Instructions  Claritin 10mg  daily for 1 week and then As needed   Delsym 2 tsp Twice daily  As needed  cough  Continue on TRELEGY 1 puff daily, rinse after use  CT in December as planned .  Flu shot .  Follow up with Dr. Valeta Harms in 2 months  with PFTs,  Please contact office for sooner follow up if symptoms do not improve or worsen or seek emergency care       Lung nodule 8 mm right lower lobe nodule.  Has been stable on serial CT chest.  CT chest December 2022 is pending.     Rexene Edison, NP 02/21/2021

## 2021-03-09 ENCOUNTER — Telehealth: Payer: Self-pay | Admitting: Pulmonary Disease

## 2021-03-09 NOTE — Telephone Encounter (Signed)
Called and spoke with patient who wants to check with BI to see if he feels like pft is necessary.  She saw TP last & she ordered pft & follow up with BI and she has pft & ov appt set up in Dec.   Dr. Valeta Harms please advise

## 2021-03-10 NOTE — Telephone Encounter (Signed)
Spoke to patient and relayed below message.  Patient would like to cancel PFT, as she back to baseline.  Nothing further needed.

## 2021-03-28 ENCOUNTER — Ambulatory Visit
Admission: RE | Admit: 2021-03-28 | Discharge: 2021-03-28 | Disposition: A | Discharge status: Home / Self Care | Payer: Medicare Other | Source: Ambulatory Visit | Attending: Pulmonary Disease | Admitting: Pulmonary Disease

## 2021-03-28 DIAGNOSIS — R911 Solitary pulmonary nodule: Secondary | ICD-10-CM

## 2021-03-29 ENCOUNTER — Telehealth: Payer: Self-pay | Admitting: Pulmonary Disease

## 2021-03-29 MED ORDER — TRELEGY ELLIPTA 100-62.5-25 MCG/ACT IN AEPB: 1.0000 | INHALATION_SPRAY | Freq: Every day | RESPIRATORY_TRACT | 2 refills | Status: DC

## 2021-03-29 NOTE — Telephone Encounter (Signed)
Refill has been sent to the pharmacy per pts request.  Nothing further is needed.  

## 2021-05-02 ENCOUNTER — Ambulatory Visit (INDEPENDENT_AMBULATORY_CARE_PROVIDER_SITE_OTHER): Payer: Medicare Other | Admitting: Family Medicine

## 2021-05-02 ENCOUNTER — Other Ambulatory Visit: Payer: Self-pay

## 2021-05-02 ENCOUNTER — Encounter: Payer: Self-pay | Admitting: Family Medicine

## 2021-05-02 VITALS — BP 147/76 | HR 91 | Ht 59.0 in | Wt 145.0 lb

## 2021-05-02 DIAGNOSIS — I10 Essential (primary) hypertension: Secondary | ICD-10-CM

## 2021-05-02 DIAGNOSIS — E785 Hyperlipidemia, unspecified: Secondary | ICD-10-CM

## 2021-05-02 DIAGNOSIS — R209 Unspecified disturbances of skin sensation: Secondary | ICD-10-CM | POA: Diagnosis not present

## 2021-05-02 DIAGNOSIS — E039 Hypothyroidism, unspecified: Secondary | ICD-10-CM

## 2021-05-02 DIAGNOSIS — J432 Centrilobular emphysema: Secondary | ICD-10-CM

## 2021-05-02 DIAGNOSIS — S32000A Wedge compression fracture of unspecified lumbar vertebra, initial encounter for closed fracture: Secondary | ICD-10-CM

## 2021-05-02 NOTE — Assessment & Plan Note (Signed)
Update TSH

## 2021-05-02 NOTE — Patient Instructions (Signed)
You can take lyrica 50mg  twice per day.  See me again in 6 months.

## 2021-05-02 NOTE — Progress Notes (Signed)
Allison Zuniga Zuniga - 80 y.o. female MRN [id number]  Date of birth: 1940-09-18  Subjective No chief complaint on file.   HPI Allison Zuniga Zuniga is a 80 year old female here today for follow-up visit.  She reports some increased neuropathic symptoms and is little confused on how she should be taking her pregabalin.  This has been prescribed at 50 mg daily but she has only been taking 25 mg.  She does not feel like this is effective.  She is taking Synthroid 75 mcg daily.  She is doing well with this.  Denies any new or worsening symptoms.  Blood pressures remain fairly well controlled with combination of irbesartan and atenolol.  No side effects noted from this.  Continues to see rheumatology for management of ankylosing spondylitis.  Methotrexate.  She is taking a folic acid supplement as well.  ROS:  A comprehensive ROS was completed and negative except as noted per HPI  Allergies  Allergen Reactions   Sulfa Antibiotics Other (See Comments)    Chest tightness   Aspirin Hives   Penicillins Hives    Past Medical History:  Diagnosis Date   COPD (chronic obstructive pulmonary disease) (HCC)    Former smoker    Hypertension    Hypothyroidism    Pneumonia    > 10 years ago per pt     Past Surgical History:  Procedure Laterality Date   EYE SURGERY     bilateral cataract removal 2021 per pt    KYPHOPLASTY N/A 09/24/2019   Procedure: THORACIC TWELVE, LUMBAR ONE KYPHOPLASTY;  Surgeon: Melina Schools, MD;  Location: [facility];  Service: Orthopedics;  Laterality: N/A;  68 ins    Social History   Socioeconomic History   Marital status: Married    Spouse name: Allison Zuniga Zuniga   Number of children: Not on file   Years of education: Not on file   Highest education level: Not on file  Occupational History   Not on file  Tobacco Use   Smoking status: Former    Packs/day: 1.00    Years: 30.00    Pack years: 30.00    Types: Cigarettes    Quit date: 2015    Years since quitting: 7.9   Smokeless  tobacco: Former  Scientific laboratory technician Use: Never used  Substance and Sexual Activity   Alcohol use: Yes    Alcohol/week: 1.0 - 2.0 standard drink    Types: 1 - 2 Standard drinks or equivalent per week   Drug use: Never   Sexual activity: Not Currently  Other Topics Concern   Not on file  Social History Narrative   Not on file   Social Determinants of Health   Financial Resource Strain: Not on file  Food Insecurity: Not on file  Transportation Needs: Not on file  Physical Activity: Not on file  Stress: Not on file  Social Connections: Not on file    Family History  Problem Relation Age of Onset   Hypertension Mother    Asthma Mother    Hypertension Father     Health Maintenance  Topic Date Due   Hepatitis C Screening  Never done   TETANUS/TDAP  Never done   Zoster Vaccines- Shingrix (1 of 2) Never done   DEXA SCAN  Never done   Pneumonia Vaccine 76+ Years old (2 - PPSV23 if available, else PCV20) 04/25/2016   COVID-19 Vaccine (4 - Booster for Moderna series) 06/20/2021   INFLUENZA VACCINE  Completed   HPV VACCINES  Aged  Out     ----------------------------------------------------------------------------------------------------------------------------------------------------------------------------------------------------------------- Physical Exam BP (!) 147/76    Pulse 91    Ht 4\' 11"  (1.499 m)    Wt 145 lb (65.8 kg)    SpO2 99%    BMI 29.29 kg/m   Physical Exam Constitutional:      Appearance: Normal appearance.  HENT:     Head: Normocephalic and atraumatic.  Eyes:     General: No scleral icterus. Cardiovascular:     Rate and Rhythm: Normal rate and regular rhythm.  Pulmonary:     Effort: Pulmonary effort is normal.     Breath sounds: Normal breath sounds.  Musculoskeletal:     Cervical back: Neck supple.  Neurological:     General: No focal deficit present.     Mental Status: She is alert.  Psychiatric:        Mood and Affect: Mood normal.         Behavior: Behavior normal.    ------------------------------------------------------------------------------------------------------------------------------------------------------------------------------------------------------------------- Assessment and Plan  Essential hypertension Blood pressure is mildly elevated today.  She will continue to monitor this at home with continuation of current medications for management of hypertension.  Low-sodium diet encouraged.  Hypothyroidism Update TSH.  Lumbar compression fracture [facility]) She has been taking pregabalin for management of symptoms related to this.  She is also having some increased neuropathic symptoms.  Discussed that she can take Lyrica 50 mg twice daily.  Hyperlipidemia with target LDL less than 100 Update lipid panel.  Chronic obstructive pulmonary disease, unspecified ([place]) Managed by pulmonology.  Stable at this time.   No orders of the defined types were placed in this encounter.   Return in about 6 months (around 10/31/2021) for HTN/Thyroid.    This visit occurred during the SARS-CoV-2 public health emergency.  Safety protocols were in place, including screening questions prior to the visit, additional usage of staff PPE, and extensive cleaning of exam room while observing appropriate contact time as indicated for disinfecting solutions.

## 2021-05-02 NOTE — Assessment & Plan Note (Signed)
Blood pressure is mildly elevated today.  She will continue to monitor this at home with continuation of current medications for management of hypertension.  Low-sodium diet encouraged.

## 2021-05-02 NOTE — Assessment & Plan Note (Signed)
Update lipid panel.  

## 2021-05-02 NOTE — Assessment & Plan Note (Signed)
Managed by pulmonology.  Stable at this time.

## 2021-05-02 NOTE — Assessment & Plan Note (Signed)
She has been taking pregabalin for management of symptoms related to this.  She is also having some increased neuropathic symptoms.  Discussed that she can take Lyrica 50 mg twice daily.

## 2021-05-03 LAB — CBC WITH DIFFERENTIAL/PLATELET
Absolute Monocytes: 588 cells/uL (ref 200–950)
Basophils Absolute: 63 cells/uL (ref 0–200)
Basophils Relative: 0.9 %
Eosinophils Absolute: 147 cells/uL (ref 15–500)
Eosinophils Relative: 2.1 %
HCT: 40.6 % (ref 35.0–45.0)
Hemoglobin: 13.6 g/dL (ref 11.7–15.5)
Lymphs Abs: 1834 cells/uL (ref 850–3900)
MCH: 30.9 pg (ref 27.0–33.0)
MCHC: 33.5 g/dL (ref 32.0–36.0)
MCV: 92.3 fL (ref 80.0–100.0)
MPV: 9.7 fL (ref 7.5–12.5)
Monocytes Relative: 8.4 %
Neutro Abs: 4368 cells/uL (ref 1500–7800)
Neutrophils Relative %: 62.4 %
Platelets: 361 10*3/uL (ref 140–400)
RBC: 4.4 10*6/uL (ref 3.80–5.10)
RDW: 12.6 % (ref 11.0–15.0)
Total Lymphocyte: 26.2 %
WBC: 7 10*3/uL (ref 3.8–10.8)

## 2021-05-03 LAB — COMPLETE METABOLIC PANEL WITH GFR
AG Ratio: 1.4 (calc) (ref 1.0–2.5)
ALT: 9 U/L (ref 6–29)
AST: 16 U/L (ref 10–35)
Albumin: 4.2 g/dL (ref 3.6–5.1)
Alkaline phosphatase (APISO): 92 U/L (ref 37–153)
BUN: 22 mg/dL (ref 7–25)
CO2: 29 mmol/L (ref 20–32)
Calcium: 9.7 mg/dL (ref 8.6–10.4)
Chloride: 103 mmol/L (ref 98–110)
Creat: 0.64 mg/dL (ref 0.60–1.00)
Globulin: 3 g/dL (calc) (ref 1.9–3.7)
Glucose, Bld: 81 mg/dL (ref 65–99)
Potassium: 3.9 mmol/L (ref 3.5–5.3)
Sodium: 141 mmol/L (ref 135–146)
Total Bilirubin: 0.6 mg/dL (ref 0.2–1.2)
Total Protein: 7.2 g/dL (ref 6.1–8.1)
eGFR: 90 mL/min/{1.73_m2} (ref >=60)

## 2021-05-03 LAB — VITAMIN B12: Vitamin B-12: 1503 pg/mL — ABNORMAL HIGH (ref 200–1100)

## 2021-05-03 LAB — LIPID PANEL W/REFLEX DIRECT LDL
Cholesterol: 197 mg/dL (ref <200)
HDL: 65 mg/dL (ref >=50)
LDL Cholesterol (Calc): 114 mg/dL (calc) — ABNORMAL HIGH
Non-HDL Cholesterol (Calc): 132 mg/dL (calc) — ABNORMAL HIGH (ref <130)
Total CHOL/HDL Ratio: 3 (calc) (ref <5.0)
Triglycerides: 81 mg/dL (ref <150)

## 2021-05-03 LAB — TSH: TSH: 2.3 mIU/L (ref 0.40–4.50)

## 2021-05-04 ENCOUNTER — Ambulatory Visit: Payer: Medicare Other | Admitting: Pulmonary Disease

## 2021-05-04 NOTE — Progress Notes (Incomplete)
Synopsis: Referred in September 2020 for COPD/lung nodules by Luetta Nutting, DO  Subjective:   PATIENT ID: Allison Zuniga GENDER: female DOB: May 30, 1940, MRN: [id number]  No chief complaint on file.   80 year old female, former smoker, secondhand smoke exposure from husband, started smoking 2 yo, 1 packs per day, for ~45 years.  She lives in [place]. Was diagnosed with COPD some where arounf 5 years agos. Recently started on Anoro this past week. She was spiriva for for >10 years. She did have PFTs with about 3 years completed at [facility].   Back in July she had an excerbation. Developed SOB and had DOE. Just completed a course of levaquin started by her PCP. Finished her abx and steroid course last week for ongoing exacerbation symptoms.  She is feeling better since her last exacerbation.  She is only been on Anoro Ellipta approximately 1 week.  She is not sure she has seen much difference in her breathing.  She is using her albuterol inhaler 1-2 times per day.  She does not have a nebulizer at home.  Denies fevers chills night sweats weight loss.  Patient's daughter is Allison Zuniga.  I called and spoke with her as well.  She was appreciative of the update since she could not be present with her mother today in the office due to [place] visitor restrictions.   OV 03/12/2019: Here today for follow-up regarding COPD management.  Since last office visit was able to obtain CT imaging from [facility] in [place].  Review of the images revealed some areas of tree-in-bud opacities mild bronchiectasis and scarring at the bases.  She was also found to have an 8 mm right lower lobe lung nodule.  We discussed this via telephone back in September.  She has planned HRCT follow-up in 3 months.  Echocardiogram revealed mild AI.  She was referred to cardiology.  This appointment is scheduled for October 30.  Patient doing well today.  She states that she is breathing much better than where she was  before.  She likes being on the Trelegy.  She does feel as if she has having recurrent muscle cramps in her legs and lower back and hips.  She is worried that this may be related to the initiation of the Trelegy inhaler.  However she is breathing much better than she was and does not want to go off if at all possible.  OV 10/22/2019: Here today for follow-up regarding COPD management.  At this time she is back on her home tiotropium dosing that she was on for the past several years.  Recently had back surgery.  She has been recovering from this nicely.  Occasionally still has back pain.  Otherwise her respiratory status is stable.  She rarely uses her albuterol inhaler.  Patient denies hemoptysis.  Denies wheezing or significant dyspnea on exertion.  Patient had CT scan of the chest completed in November of this past year with stable small subcentimeter lung nodules.  Recommending 1 year follow-up.  OV 04/28/2020: Patient here today for follow-up regarding recent CT scan.CT scan of the chest was completed on 04/21/2020.  This revealed stability of the patient's 8 mm nodule noted in the right lower lobe.  The prior lingular nodule has resolved.  She does have evidence of bronchiectasis and considerations for atypical inflammation such as MAI.  From a respiratory standpoint patient has been doing well.  She does feel like she was doing better breathing while she was on Trelegy  and would like to go back on this.  OV 05/04/2021: Here today for follow-up after recent CT imaging.Patient had CT imaging completed on 03/28/2021 for lung nodule follow-up.  This revealed an unchanged 8 cm pulmonary nodule central right lower lobe dating back since 2020 and considered benign at this point.  He has had no significant change.  She also has some little scattered areas of tree-in-bud nodularity consistent with possible atypical infection.       Past Medical History:  Diagnosis Date   COPD (chronic obstructive pulmonary  disease) (HCC)    Former smoker    Hypertension    Hypothyroidism    Pneumonia    > 10 years ago per pt      Family History  Problem Relation Age of Onset   Hypertension Mother    Asthma Mother    Hypertension Father      Past Surgical History:  Procedure Laterality Date   EYE SURGERY     bilateral cataract removal 2021 per pt    KYPHOPLASTY N/A 09/24/2019   Procedure: THORACIC TWELVE, LUMBAR ONE KYPHOPLASTY;  Surgeon: Melina Schools, MD;  Location: [facility];  Service: Orthopedics;  Laterality: N/A;  19 ins    Social History   Socioeconomic History   Marital status: Married    Spouse name: Maryella Abood   Number of children: Not on file   Years of education: Not on file   Highest education level: Not on file  Occupational History   Not on file  Tobacco Use   Smoking status: Former    Packs/day: 1.00    Years: 30.00    Pack years: 30.00    Types: Cigarettes    Quit date: 2015    Years since quitting: 7.9   Smokeless tobacco: Former  Scientific laboratory technician Use: Never used  Substance and Sexual Activity   Alcohol use: Yes    Alcohol/week: 1.0 - 2.0 standard drink    Types: 1 - 2 Standard drinks or equivalent per week   Drug use: Never   Sexual activity: Not Currently  Other Topics Concern   Not on file  Social History Narrative   Not on file   Social Determinants of Health   Financial Resource Strain: Not on file  Food Insecurity: Not on file  Transportation Needs: Not on file  Physical Activity: Not on file  Stress: Not on file  Social Connections: Not on file  Intimate Partner Violence: Not on file     Allergies  Allergen Reactions   Sulfa Antibiotics Other (See Comments)    Chest tightness   Aspirin Hives   Penicillins Hives     Outpatient Medications Prior to Visit  Medication Sig Dispense Refill   albuterol (VENTOLIN HFA) 108 (90 Base) MCG/ACT inhaler Inhale 1 puff into the lungs every 6 (six) hours as needed for wheezing  or shortness of breath. 6.7 g 5   atenolol (TENORMIN) 50 MG tablet Take 50 mg by mouth at bedtime.     ciclopirox (PENLAC) 8 % solution Apply topically at bedtime. Apply over nail and surrounding skin. Apply daily over previous coat. After seven (7) days, may remove with alcohol and continue cycle. 6.6 mL 0   cyanocobalamin 100 MCG tablet Take 1,000 mcg by mouth daily.     denosumab (PROLIA) 60 MG/ML SOSY injection Inject 60 mg into the skin every 6 (six) months. (Patient not taking: Reported on 05/02/2021)     Fluticasone-Umeclidin-Vilant (TRELEGY ELLIPTA) 100-62.5-25 MCG/ACT  AEPB Inhale 1 puff into the lungs daily. 60 each 2   folic acid (FOLVITE) 1 MG tablet Take 2 mg by mouth daily.     irbesartan (AVAPRO) 150 MG tablet Take 150 mg by mouth daily.     methotrexate 2.5 MG tablet Take 15 mg by mouth every Monday.     OVER THE COUNTER MEDICATION Take 500 mg by mouth 2 (two) times daily. Citracel D3     pregabalin (LYRICA) 50 MG capsule Take 1 capsule (50 mg total) by mouth daily. (Patient taking differently: Take 25 mg by mouth daily.) 90 capsule 1   PREVIDENT 1.1 % GEL dental gel Place 1 application onto teeth in the morning and at bedtime.     SYNTHROID 75 MCG tablet Take 1 tablet (75 mcg total) by mouth daily before breakfast. 90 tablet 1   No facility-administered medications prior to visit.    Review of Systems  Constitutional:  Negative for chills, fever, malaise/fatigue and weight loss.  HENT:  Negative for hearing loss, sore throat and tinnitus.   Eyes:  Negative for blurred vision and double vision.  Respiratory:  Positive for shortness of breath. Negative for cough, hemoptysis, sputum production, wheezing and stridor.   Cardiovascular:  Negative for chest pain, palpitations, orthopnea, leg swelling and PND.  Gastrointestinal:  Negative for abdominal pain, constipation, diarrhea, heartburn, nausea and vomiting.  Genitourinary:  Negative for dysuria, hematuria and  urgency.  Musculoskeletal:  Negative for joint pain and myalgias.  Skin:  Negative for itching and rash.  Neurological:  Negative for dizziness, tingling, weakness and headaches.  Endo/Heme/Allergies:  Negative for environmental allergies. Does not bruise/bleed easily.  Psychiatric/Behavioral:  Negative for depression. The patient is not nervous/anxious and does not have insomnia.   All other systems reviewed and are negative.   Objective:  Physical Exam Vitals reviewed.  Constitutional:      General: She is not in acute distress.    Appearance: She is well-developed.  HENT:     Head: Normocephalic and atraumatic.  Eyes:     General: No scleral icterus.    Conjunctiva/sclera: Conjunctivae normal.     Pupils: Pupils are equal, round, and reactive to light.  Neck:     Vascular: No JVD.     Trachea: No tracheal deviation.  Cardiovascular:     Rate and Rhythm: Normal rate and regular rhythm.     Heart sounds: Normal heart sounds. No murmur heard. Pulmonary:     Effort: Pulmonary effort is normal. No tachypnea, accessory muscle usage or respiratory distress.     Breath sounds: Normal breath sounds. No stridor. No wheezing, rhonchi or rales.  Abdominal:     General: Bowel sounds are normal. There is no distension.     Palpations: Abdomen is soft.     Tenderness: There is no abdominal tenderness.  Musculoskeletal:        General: No tenderness.     Cervical back: Neck supple.  Lymphadenopathy:     Cervical: No cervical adenopathy.  Skin:    General: Skin is warm and dry.     Capillary Refill: Capillary refill takes less than 2 seconds.     Findings: No rash.  Neurological:     Mental Status: She is alert and oriented to person, place, and time.  Psychiatric:        Behavior: Behavior normal.     There were no vitals filed for this visit.    on  RA BMI Readings from Last  3 Encounters:  05/02/21 29.29 kg/m  02/21/21 29.29 kg/m  01/20/21 28.52 kg/m   Wt Readings  from Last 3 Encounters:  05/02/21 145 lb (65.8 kg)  02/21/21 145 lb (65.8 kg)  01/20/21 144 lb 12.8 oz (65.7 kg)     CBC    Component Value Date/Time   WBC 7.0 05/02/2021 0000   RBC 4.40 05/02/2021 0000   HGB 13.6 05/02/2021 0000   HCT 40.6 05/02/2021 0000   PLT 361 05/02/2021 0000   MCV 92.3 05/02/2021 0000   MCH 30.9 05/02/2021 0000   MCHC 33.5 05/02/2021 0000   RDW 12.6 05/02/2021 0000   LYMPHSABS 1,834 05/02/2021 0000   EOSABS 147 05/02/2021 0000   BASOSABS 63 05/02/2021 0000     Chest Imaging:     Chest x-ray from Dr. Ainsley Spinner office.  Density within the right lower lobe, infiltrate in the left lower lobe.  Upper lobe evidence of emphysema. The patient's images have been independently reviewed by me.    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  Some areas of mild bronchiectasis. Overall lung nodules are stable. The patient's images have been independently reviewed by me.    Pulmonary Functions Testing Results: No flowsheet data found.  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   Discussion:  80 year old female, COPD, lower lobe lung nodule.  Stable at this time on CT imaging.  She does have some changes of bronchiectasis and tree-in-bud opacities concerning for possible underlying MAI  We reviewed the patient's CT scan in detail with daughter, husband and patient at bedside today.  Plan: Patient prefer to go back on Trelegy. Restarted this today given patient samples.  If she likes samples happy to send prescription to her pharmacy she will let us know. Follow-up noncontrasted CT of the chest in 1 year for nodule follow-up. Patient was counseled on progressive symptoms of MAI/NTM infection.  If she has any of these would consider  sputum culture for evaluation and or bronchoscopy in the future if needed.  But at this time she has no other significant respiratory symptoms.    Current Outpatient Medications:    albuterol (VENTOLIN HFA) 108 (90 Base) MCG/ACT inhaler, Inhale 1 puff into the lungs every 6 (six) hours as needed for wheezing or shortness of breath., Disp: 6.7 g, Rfl: 5   atenolol (TENORMIN) 50 MG tablet, Take 50 mg by mouth at bedtime., Disp: , Rfl:    ciclopirox (PENLAC) 8 % solution, Apply topically at bedtime. Apply over nail and surrounding skin. Apply daily over previous coat. After seven (7) days, may remove with alcohol and continue cycle., Disp: 6.6 mL, Rfl: 0   cyanocobalamin 100 MCG tablet, Take 1,000 mcg by mouth daily., Disp: , Rfl:    denosumab (PROLIA) 60 MG/ML SOSY injection, Inject 60 mg into the skin every 6 (six) months. (Patient not taking: Reported on 05/02/2021), Disp: , Rfl:    Fluticasone-Umeclidin-Vilant (TRELEGY ELLIPTA) 100-62.5-25 MCG/ACT AEPB, Inhale 1 puff into the lungs daily., Disp: 60 each, Rfl: 2   folic acid (FOLVITE) 1 MG tablet, Take 2 mg by mouth daily., Disp: , Rfl:    irbesartan (AVAPRO) 150 MG tablet, Take 150 mg by mouth daily., Disp: , Rfl:    methotrexate 2.5 MG tablet, Take 15  mg by mouth every Monday., Disp: , Rfl:    OVER THE COUNTER MEDICATION, Take 500 mg by mouth 2 (two) times daily. Citracel D3, Disp: , Rfl:    pregabalin (LYRICA) 50 MG capsule, Take 1 capsule (50 mg total) by mouth daily. (Patient taking differently: Take 25 mg by mouth daily.), Disp: 90 capsule, Rfl: 1   PREVIDENT 1.1 % GEL dental gel, Place 1 application onto teeth in the morning and at bedtime., Disp: , Rfl:    SYNTHROID 75 MCG tablet, Take 1 tablet (75 mcg total) by mouth daily before breakfast., Disp: 90 tablet, Rfl: 1    Garner Nash, DO [facility] 05/04/2021 11:19 AM

## 2021-05-13 NOTE — Progress Notes (Signed)
I spoke with patient regarding results.   Thanks,  BLI  Garner Nash, DO [facility] 05/13/2021 3:07 PM

## 2021-05-31 ENCOUNTER — Other Ambulatory Visit: Payer: Self-pay

## 2021-05-31 MED ORDER — PREGABALIN 50 MG PO CAPS: 50.0000 mg | ORAL_CAPSULE | Freq: Every day | ORAL | 1 refills | Status: DC

## 2021-06-02 ENCOUNTER — Telehealth: Payer: Self-pay | Admitting: Family Medicine

## 2021-06-02 ENCOUNTER — Other Ambulatory Visit: Payer: Self-pay | Admitting: Family Medicine

## 2021-06-02 MED ORDER — PREGABALIN 50 MG PO CAPS: 50.0000 mg | ORAL_CAPSULE | Freq: Two times a day (BID) | ORAL | 1 refills | Status: DC

## 2021-06-02 NOTE — Telephone Encounter (Signed)
Pt called in reference to Pregabalin prescription. It has not been filled by the pharmacy and she is concerned that it has something to do with the RX increasing from 1 pill a day to 2 pills a day. The current RX still says one a day. So technically the pharmacy believes that she should still have some left.   Also she stated that she may have a UTI and wants to know if there is anything you can call in for her.

## 2021-06-02 NOTE — Telephone Encounter (Signed)
Updated Rx sent in.  Recommend visit to discuss UTI symptoms.

## 2021-06-02 NOTE — Telephone Encounter (Signed)
Contacted pt and informed her that her RX was updated.

## 2021-06-03 ENCOUNTER — Ambulatory Visit (INDEPENDENT_AMBULATORY_CARE_PROVIDER_SITE_OTHER): Payer: Medicare Other | Admitting: Family Medicine

## 2021-06-03 ENCOUNTER — Encounter: Payer: Self-pay | Admitting: Family Medicine

## 2021-06-03 ENCOUNTER — Other Ambulatory Visit: Payer: Self-pay

## 2021-06-03 VITALS — BP 164/84 | HR 69 | Temp 97.5°F | Ht 59.0 in | Wt 149.9 lb

## 2021-06-03 DIAGNOSIS — N3090 Cystitis, unspecified without hematuria: Secondary | ICD-10-CM | POA: Diagnosis not present

## 2021-06-03 DIAGNOSIS — R300 Dysuria: Secondary | ICD-10-CM

## 2021-06-03 DIAGNOSIS — R8290 Unspecified abnormal findings in urine: Secondary | ICD-10-CM | POA: Diagnosis not present

## 2021-06-03 LAB — POCT URINALYSIS DIP (CLINITEK)
Bilirubin, UA: NEGATIVE
Glucose, UA: NEGATIVE mg/dL
Ketones, POC UA: NEGATIVE mg/dL
Nitrite, UA: NEGATIVE
POC PROTEIN,UA: 30 — AB
Spec Grav, UA: 1.025 (ref 1.010–1.025)
Urobilinogen, UA: 0.2 E.U./dL
pH, UA: 5.5 (ref 5.0–8.0)

## 2021-06-03 MED ORDER — CEPHALEXIN 500 MG PO CAPS: 500.0000 mg | ORAL_CAPSULE | Freq: Two times a day (BID) | ORAL | 0 refills | Status: DC

## 2021-06-03 NOTE — Patient Instructions (Signed)
Urinary Tract Infection, Adult A urinary tract infection (UTI) is an infection of any part of the urinary tract. The urinary tract includes the kidneys, ureters, bladder, and urethra. These organs make, store, and get rid of urine in the body. An upper UTI affects the ureters and kidneys. A lower UTI affects the bladder and urethra. What are the causes? Most urinary tract infections are caused by bacteria in your genital area around your urethra, where urine leaves your body. These bacteria grow and cause inflammation of your urinary tract. What increases the risk? You are more likely to develop this condition if: You have a urinary catheter that stays in place. You are not able to control when you urinate or have a bowel movement (incontinence). You are female and you: Use a spermicide or diaphragm for birth control. Have low estrogen levels. Are pregnant. You have certain genes that increase your risk. You are sexually active. You take antibiotic medicines. You have a condition that causes your flow of urine to slow down, such as: An enlarged prostate, if you are female. Blockage in your urethra. A kidney stone. A nerve condition that affects your bladder control (neurogenic bladder). Not getting enough to drink, or not urinating often. You have certain medical conditions, such as: Diabetes. A weak disease-fighting system (immunesystem). Sickle cell disease. Gout. Spinal cord injury. What are the signs or symptoms? Symptoms of this condition include: Needing to urinate right away (urgency). Frequent urination. This may include small amounts of urine each time you urinate. Pain or burning with urination. Blood in the urine. Urine that smells bad or unusual. Trouble urinating. Cloudy urine. Vaginal discharge, if you are female. Pain in the abdomen or the lower back. You may also have: Vomiting or a decreased appetite. Confusion. Irritability or tiredness. A fever or  chills. Diarrhea. The first symptom in older adults may be confusion. In some cases, they may not have any symptoms until the infection has worsened. How is this diagnosed? This condition is diagnosed based on your medical history and a physical exam. You may also have other tests, including: Urine tests. Blood tests. Tests for STIs (sexually transmitted infections). If you have had more than one UTI, a cystoscopy or imaging studies may be done to determine the cause of the infections. How is this treated? Treatment for this condition includes: Antibiotic medicine. Over-the-counter medicines to treat discomfort. Drinking enough water to stay hydrated. If you have frequent infections or have other conditions such as a kidney stone, you may need to see a health care provider who specializes in the urinary tract (urologist). In rare cases, urinary tract infections can cause sepsis. Sepsis is a life-threatening condition that occurs when the body responds to an infection. Sepsis is treated in the hospital with IV antibiotics, fluids, and other medicines. Follow these instructions at home: Medicines Take over-the-counter and prescription medicines only as told by your health care provider. If you were prescribed an antibiotic medicine, take it as told by your health care provider. Do not stop using the antibiotic even if you start to feel better. General instructions Make sure you: Empty your bladder often and completely. Do not hold urine for long periods of time. Empty your bladder after sex. Wipe from front to back after urinating or having a bowel movement if you are female. Use each tissue only one time when you wipe. Drink enough fluid to keep your urine pale yellow. Keep all follow-up visits. This is important. Contact a health care provider   if: Your symptoms do not get better after 1-2 days. Your symptoms go away and then return. Get help right away if: You have severe pain in your  back or your lower abdomen. You have a fever or chills. You have nausea or vomiting. Summary A urinary tract infection (UTI) is an infection of any part of the urinary tract, which includes the kidneys, ureters, bladder, and urethra. Most urinary tract infections are caused by bacteria in your genital area. Treatment for this condition often includes antibiotic medicines. If you were prescribed an antibiotic medicine, take it as told by your health care provider. Do not stop using the antibiotic even if you start to feel better. Keep all follow-up visits. This is important. This information is not intended to replace advice given to you by your health care provider. Make sure you discuss any questions you have with your health care provider. Document Revised: 12/12/2019 Document Reviewed: 12/12/2019 Elsevier Patient Education  2022 Elsevier Inc.  

## 2021-06-05 ENCOUNTER — Encounter: Payer: Self-pay | Admitting: Family Medicine

## 2021-06-05 DIAGNOSIS — N3090 Cystitis, unspecified without hematuria: Secondary | ICD-10-CM | POA: Insufficient documentation

## 2021-06-05 NOTE — Progress Notes (Signed)
Allison Zuniga - 81 y.o. female MRN [id number]  Date of birth: 1941-02-04  Subjective Chief Complaint  Patient presents with   Urinary Tract Infection    HPI Allison Zuniga is a 81 year old female here today with complaint of dysuria, urgency and frequency.  Symptoms started a few days ago.  She has not noted any blood in her urine.  She denies vaginal discharge, fever or chills.  Symptoms are similar to previous UTIs she has had.  ROS:  A comprehensive ROS was completed and negative except as noted per HPI  Allergies  Allergen Reactions   Sulfa Antibiotics Other (See Comments)    Chest tightness   Aspirin Hives   Penicillins Hives    Past Medical History:  Diagnosis Date   COPD (chronic obstructive pulmonary disease) (HCC)    Former smoker    Hypertension    Hypothyroidism    Pneumonia    > 10 years ago per pt     Past Surgical History:  Procedure Laterality Date   EYE SURGERY     bilateral cataract removal 2021 per pt    KYPHOPLASTY N/A 09/24/2019   Procedure: THORACIC TWELVE, LUMBAR ONE KYPHOPLASTY;  Surgeon: Melina Schools, MD;  Location: [facility];  Service: Orthopedics;  Laterality: N/A;  70 ins    Social History   Socioeconomic History   Marital status: Married    Spouse name: Allison Zuniga   Number of children: Not on file   Years of education: Not on file   Highest education level: Not on file  Occupational History   Not on file  Tobacco Use   Smoking status: Former    Packs/day: 1.00    Years: 30.00    Pack years: 30.00    Types: Cigarettes    Quit date: 2015    Years since quitting: 8.0   Smokeless tobacco: Former  Scientific laboratory technician Use: Never used  Substance and Sexual Activity   Alcohol use: Yes    Alcohol/week: 1.0 - 2.0 standard drink    Types: 1 - 2 Standard drinks or equivalent per week   Drug use: Never   Sexual activity: Not Currently  Other Topics Concern   Not on file  Social History Narrative   Not on file   Social Determinants of  Health   Financial Resource Strain: Not on file  Food Insecurity: Not on file  Transportation Needs: Not on file  Physical Activity: Not on file  Stress: Not on file  Social Connections: Not on file    Family History  Problem Relation Age of Onset   Hypertension Mother    Asthma Mother    Hypertension Father     Health Maintenance  Topic Date Due   Hepatitis C Screening  Never done   TETANUS/TDAP  Never done   Zoster Vaccines- Shingrix (1 of 2) Never done   DEXA SCAN  Never done   Pneumonia Vaccine 57+ Years old (2 - PPSV23 if available, else PCV20) 04/25/2016   COVID-19 Vaccine (4 - Booster for Moderna series) 06/20/2021   INFLUENZA VACCINE  Completed   HPV VACCINES  Aged Out     ----------------------------------------------------------------------------------------------------------------------------------------------------------------------------------------------------------------- Physical Exam BP (!) 164/84 (BP Location: Left Arm, Patient Position: Sitting, Cuff Size: Small)    Pulse 69    Temp (!) 97.5 F (36.4 C)    Ht 4\' 11"  (1.499 m)    Wt 149 lb 14.4 oz (68 kg)    SpO2 99%    BMI 30.28  kg/m   Physical Exam Constitutional:      Appearance: Normal appearance.  Eyes:     General: No scleral icterus. Cardiovascular:     Rate and Rhythm: Normal rate and regular rhythm.  Pulmonary:     Effort: Pulmonary effort is normal.     Breath sounds: Normal breath sounds.  Musculoskeletal:     Cervical back: Neck supple.  Neurological:     Mental Status: She is alert.  Psychiatric:        Mood and Affect: Mood normal.        Behavior: Behavior normal.    ------------------------------------------------------------------------------------------------------------------------------------------------------------------------------------------------------------------- Assessment and Plan  Cystitis Urinalysis consistent with UTI.  Sent for culture.  Cover empirically  with Keflex.   Meds ordered this encounter  Medications   cephALEXin (KEFLEX) 500 MG capsule    Sig: Take 1 capsule (500 mg total) by mouth 2 (two) times daily for 7 days.    Dispense:  14 capsule    Refill:  0    No follow-ups on file.    This visit occurred during the SARS-CoV-2 public health emergency.  Safety protocols were in place, including screening questions prior to the visit, additional usage of staff PPE, and extensive cleaning of exam room while observing appropriate contact time as indicated for disinfecting solutions.

## 2021-06-05 NOTE — Assessment & Plan Note (Signed)
Urinalysis consistent with UTI.  Sent for culture.  Cover empirically with Keflex.

## 2021-06-06 ENCOUNTER — Other Ambulatory Visit: Payer: Self-pay | Admitting: Family Medicine

## 2021-06-06 LAB — URINE CULTURE
MICRO NUMBER:: 12900553
SPECIMEN QUALITY:: ADEQUATE

## 2021-06-06 MED ORDER — NITROFURANTOIN MONOHYD MACRO 100 MG PO CAPS: 100.0000 mg | ORAL_CAPSULE | Freq: Two times a day (BID) | ORAL | 0 refills | Status: DC

## 2021-06-08 ENCOUNTER — Telehealth: Payer: Self-pay

## 2021-06-08 NOTE — Telephone Encounter (Signed)
Pt lvm stating Macrobid may be causing hand itching. She's afraid of an allergic reaction. Please advise.

## 2021-06-08 NOTE — Telephone Encounter (Signed)
Due to the bacteria that is present this is really the only option for her as she also has allergy to penicillin.  Alternatively I can send in 1 time dose of fosfomycin but this may be $$

## 2021-06-09 ENCOUNTER — Other Ambulatory Visit: Payer: Self-pay | Admitting: Family Medicine

## 2021-06-09 MED ORDER — FOSFOMYCIN TROMETHAMINE 3 G PO PACK: 3.0000 g | PACK | Freq: Once | ORAL | 0 refills | Status: AC

## 2021-06-09 NOTE — Telephone Encounter (Signed)
Pt has been advised of recommendation. She states that she took the medicine last night and this morning without side effects.  Advised her taking an antihistamine could help if itching recurs.

## 2021-06-21 ENCOUNTER — Telehealth: Payer: Self-pay | Admitting: Pulmonary Disease

## 2021-06-21 DIAGNOSIS — J432 Centrilobular emphysema: Secondary | ICD-10-CM

## 2021-06-21 MED ORDER — TRELEGY ELLIPTA 100-62.5-25 MCG/ACT IN AEPB: 1.0000 | INHALATION_SPRAY | Freq: Every day | RESPIRATORY_TRACT | 6 refills | Status: DC

## 2021-06-21 NOTE — Telephone Encounter (Signed)
Contacted by patient's daughter.  Patient is out of her Trelegy.  Refills sent to [facility].  Garner Nash, DO [facility] 06/21/2021 11:15 AM

## 2021-06-22 ENCOUNTER — Other Ambulatory Visit: Payer: Self-pay | Admitting: Family Medicine

## 2021-07-18 ENCOUNTER — Other Ambulatory Visit: Payer: Self-pay | Admitting: Family Medicine

## 2021-10-31 ENCOUNTER — Ambulatory Visit (INDEPENDENT_AMBULATORY_CARE_PROVIDER_SITE_OTHER): Payer: Medicare Other | Admitting: Family Medicine

## 2021-10-31 ENCOUNTER — Encounter: Payer: Self-pay | Admitting: Family Medicine

## 2021-10-31 DIAGNOSIS — S32000A Wedge compression fracture of unspecified lumbar vertebra, initial encounter for closed fracture: Secondary | ICD-10-CM | POA: Diagnosis not present

## 2021-10-31 DIAGNOSIS — I10 Essential (primary) hypertension: Secondary | ICD-10-CM | POA: Diagnosis not present

## 2021-10-31 DIAGNOSIS — J432 Centrilobular emphysema: Secondary | ICD-10-CM | POA: Diagnosis not present

## 2021-10-31 DIAGNOSIS — E039 Hypothyroidism, unspecified: Secondary | ICD-10-CM

## 2021-10-31 MED ORDER — IRBESARTAN 150 MG PO TABS: 150.0000 mg | ORAL_TABLET | Freq: Every day | ORAL | 3 refills | Status: DC

## 2021-10-31 NOTE — Assessment & Plan Note (Signed)
Lab Results  Component Value Date   TSH 2.30 05/02/2021  Continue levothyroxine at current strength.

## 2021-10-31 NOTE — Progress Notes (Signed)
Allison Zuniga - 81 y.o. female MRN [id number]  Date of birth: 1940-08-08  Subjective No chief complaint on file.   HPI Allison Zuniga is a 81 y.o. female here today for follow up visit. She reports that she is doing fairly well.   Continues on combination of atenolol and irbesartan for management of HTN.  She is doing well with this and BP has been well controlled.  She denies side effects from medication.  She has not had chest pain, shortness of breath,palpitations, headache or vision changes.    Feels food with current dose of levothyroxine.  TSH has been stable.   Respiratory symptoms related to COPD are stable with trelegy.  She uses albuterol occasionally.    History of osteoporosis with vertebral compression fracture.  Doing well with prolia at this time.   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  She has also started therapy with Anodyne.  Has had some difficulty with tolerating this so far.    ROS:  A comprehensive ROS was completed and negative except as noted per HPI      Allergies  Allergen Reactions   Sulfa Antibiotics Other (See Comments)    Chest tightness   Aspirin Hives   Penicillins Hives    Past Medical History:  Diagnosis Date   COPD (chronic obstructive pulmonary disease) (HCC)    Former smoker    Hypertension    Hypothyroidism    Pneumonia    > 10 years ago per pt     Past Surgical History:  Procedure Laterality Date   EYE SURGERY     bilateral cataract removal 2021 per pt    KYPHOPLASTY N/A 09/24/2019   Procedure: THORACIC TWELVE, LUMBAR ONE KYPHOPLASTY;  Surgeon: Melina Schools, MD;  Location: [place];  Service: Orthopedics;  Laterality: N/A;  65 ins    Social History   Socioeconomic History   Marital status: Married    Spouse name: Kyleen Villatoro   Number of children: Not on file   Years of education: Not on file   Highest education level: Not on file  Occupational History   Not on file  Tobacco Use   Smoking status:  Former    Packs/day: 1.00    Years: 30.00    Total pack years: 30.00    Types: Cigarettes    Quit date: 2015    Years since quitting: 8.4   Smokeless tobacco: Former  Scientific laboratory technician Use: Never used  Substance and Sexual Activity   Alcohol use: Yes    Alcohol/week: 1.0 - 2.0 standard drink of alcohol    Types: 1 - 2 Standard drinks or equivalent per week   Drug use: Never   Sexual activity: Not Currently  Other Topics Concern   Not on file  Social History Narrative   Not on file   Social Determinants of Health   Financial Resource Strain: Not on file  Food Insecurity: Not on file  Transportation Needs: Not on file  Physical Activity: Not on file  Stress: Not on file  Social Connections: Not on file    Family History  Problem Relation Age of Onset   Hypertension Mother    Asthma Mother    Hypertension Father     Health Maintenance  Topic Date Due   TETANUS/TDAP  Never done   Zoster Vaccines- Shingrix (1 of 2) Never done   DEXA SCAN  Never done   Pneumonia Vaccine 17+ Years old (2 - PPSV23 if  available, else PCV20) 06/21/2015   COVID-19 Vaccine (4 - Booster for Moderna series) 06/20/2021   INFLUENZA VACCINE  12/13/2021   HPV VACCINES  Aged Out     ----------------------------------------------------------------------------------------------------------------------------------------------------------------------------------------------------------------- Physical Exam BP 127/73 (BP Location: Left Arm, Patient Position: Sitting, Cuff Size: Normal)   Pulse 73   Ht '4\' 11"'$  (1.499 m)   Wt 149 lb (67.6 kg)   SpO2 96%   BMI 30.09 kg/m   Physical Exam Constitutional:      Appearance: Normal appearance.  Eyes:     General: No scleral icterus. Cardiovascular:     Rate and Rhythm: Normal rate and regular rhythm.  Pulmonary:     Effort: Pulmonary effort is normal.     Breath sounds: Normal breath sounds.  Musculoskeletal:     Cervical back: Neck supple.   Neurological:     General: No focal deficit present.     Mental Status: She is alert.  Psychiatric:        Mood and Affect: Mood normal.        Behavior: Behavior normal.    ------------------------------------------------------------------------------------------------------------------------------------------------------------------------------------------------------------------- Assessment and Plan  Essential hypertension BP remains well controlled with current medications.  Recommend continuation at current strength.    Hypothyroidism Lab Results  Component Value Date   TSH 2.30 05/02/2021  Continue levothyroxine at current strength.    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  Stable at this time with current dose of lyrica.  She will continue prolia as well for associated osteoporosis.    Chronic obstructive pulmonary disease, unspecified ([place]) Managed by pulmonology.  Stable with current inhalers.     Meds ordered this encounter  Medications   irbesartan (AVAPRO) 150 MG tablet    Sig: Take 1 tablet (150 mg total) by mouth daily.    Dispense:  90 tablet    Refill:  3    Return in about 6 months (around 05/02/2022) for HTN.    This visit occurred during the SARS-CoV-2 public health emergency.  Safety protocols were in place, including screening questions prior to the visit, additional usage of staff PPE, and extensive cleaning of exam room while observing appropriate contact time as indicated for disinfecting solutions.

## 2021-10-31 NOTE — Assessment & Plan Note (Signed)
BP remains well controlled with current medications.  Recommend continuation at current strength.

## 2021-10-31 NOTE — Assessment & Plan Note (Signed)
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  Stable at this time with current dose of lyrica.  She will continue prolia as well for associated osteoporosis.

## 2021-10-31 NOTE — Assessment & Plan Note (Signed)
Managed by pulmonology.  Stable with current inhalers.

## 2021-11-01 LAB — TSH+FREE T4: TSH W/REFLEX TO FT4: 2.09 mIU/L (ref 0.40–4.50)

## 2021-12-06 ENCOUNTER — Other Ambulatory Visit: Payer: Self-pay | Admitting: Family Medicine

## 2021-12-06 ENCOUNTER — Other Ambulatory Visit: Payer: Self-pay

## 2021-12-06 MED ORDER — PREGABALIN 50 MG PO CAPS: 50.0000 mg | ORAL_CAPSULE | Freq: Two times a day (BID) | ORAL | 0 refills | Status: DC

## 2021-12-11 ENCOUNTER — Other Ambulatory Visit: Payer: Self-pay | Admitting: Family Medicine

## 2022-01-23 ENCOUNTER — Other Ambulatory Visit: Payer: Self-pay | Admitting: Family Medicine

## 2022-02-27 ENCOUNTER — Ambulatory Visit (INDEPENDENT_AMBULATORY_CARE_PROVIDER_SITE_OTHER): Payer: Medicare Other | Admitting: Family Medicine

## 2022-02-27 DIAGNOSIS — Z23 Encounter for immunization: Secondary | ICD-10-CM | POA: Diagnosis not present

## 2022-03-05 ENCOUNTER — Other Ambulatory Visit: Payer: Self-pay | Admitting: Family Medicine

## 2022-03-06 NOTE — Telephone Encounter (Signed)
Lyrica refill request Last refilled as 90/d supply on  12/06/2021 Last O.V. 10/31/2021 Next appt - 05/03/2022

## 2022-03-28 DIAGNOSIS — S22000A Wedge compression fracture of unspecified thoracic vertebra, initial encounter for closed fracture: Secondary | ICD-10-CM | POA: Insufficient documentation

## 2022-04-19 ENCOUNTER — Other Ambulatory Visit: Payer: Self-pay | Admitting: Pulmonary Disease

## 2022-04-19 DIAGNOSIS — J432 Centrilobular emphysema: Secondary | ICD-10-CM

## 2022-04-19 NOTE — Telephone Encounter (Signed)
Orders placed  BLI

## 2022-05-03 ENCOUNTER — Ambulatory Visit (INDEPENDENT_AMBULATORY_CARE_PROVIDER_SITE_OTHER): Payer: Medicare Other | Admitting: Family Medicine

## 2022-05-03 ENCOUNTER — Encounter: Payer: Self-pay | Admitting: Family Medicine

## 2022-05-03 VITALS — BP 124/69 | HR 79 | Ht 59.0 in | Wt 145.0 lb

## 2022-05-03 DIAGNOSIS — E785 Hyperlipidemia, unspecified: Secondary | ICD-10-CM

## 2022-05-03 DIAGNOSIS — M810 Age-related osteoporosis without current pathological fracture: Secondary | ICD-10-CM

## 2022-05-03 DIAGNOSIS — I10 Essential (primary) hypertension: Secondary | ICD-10-CM | POA: Diagnosis not present

## 2022-05-03 DIAGNOSIS — E039 Hypothyroidism, unspecified: Secondary | ICD-10-CM | POA: Diagnosis not present

## 2022-05-03 NOTE — Assessment & Plan Note (Signed)
Updated lipid panel today.

## 2022-05-03 NOTE — Assessment & Plan Note (Signed)
History of prior compression fractures.  Restarting Prolia.  Checking vitamin D levels.

## 2022-05-03 NOTE — Assessment & Plan Note (Signed)
She feels good with current dose of levothyroxine.  Updating TSH.

## 2022-05-03 NOTE — Progress Notes (Signed)
Allison Zuniga - 81 y.o. female MRN [id number]  Date of birth: 1940-11-24  Subjective Chief Complaint  Patient presents with   Hypertension    HPI Allison Zuniga is a 81 y.o. female here today for follow up visit.   History of HTN that is managed with irbesartan and atenolol.  Doing well with current medications.  Denies side effects from medication.  Has not had chest pain, shortness of breath, .palpitations, headache or vision changes.   Feels well with current dose of levothyroxine.  Due to have updated labs.   History of osteoporosis.  Receiving prolia injections.  Has had compression fractures of the T and L spine previously, seeing ortho spine for this.  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  Has been on prednisone several times, currently on MTX.    ROS:  A comprehensive ROS was completed and negative except as noted per HPI  Allergies  Allergen Reactions   Sulfa Antibiotics Other (See Comments)    Chest tightness   Aspirin Hives   Penicillins Hives    Past Medical History:  Diagnosis Date   COPD (chronic obstructive pulmonary disease) ([place])    Former smoker    Hypertension    Hypothyroidism    Pneumonia    > 10 years ago per pt     Past Surgical History:  Procedure Laterality Date   EYE SURGERY     bilateral cataract removal 2021 per pt    KYPHOPLASTY N/A 09/24/2019   Procedure: THORACIC TWELVE, LUMBAR ONE KYPHOPLASTY;  Surgeon: Melina Schools, MD;  Location: [facility];  Service: Orthopedics;  Laterality: N/A;  84 ins    Social History   Socioeconomic History   Marital status: Married    Spouse name: Moxie Kalil   Number of children: Not on file   Years of education: Not on file   Highest education level: Not on file  Occupational History   Not on file  Tobacco Use   Smoking status: Former    Packs/day: 1.00    Years: 30.00    Total pack years: 30.00    Types: Cigarettes    Quit date: 2015    Years since quitting: 8.9    Smokeless tobacco: Former  Scientific laboratory technician Use: Never used  Substance and Sexual Activity   Alcohol use: Yes    Alcohol/week: 1.0 - 2.0 standard drink of alcohol    Types: 1 - 2 Standard drinks or equivalent per week   Drug use: Never   Sexual activity: Not Currently  Other Topics Concern   Not on file  Social History Narrative   Not on file   Social Determinants of Health   Financial Resource Strain: Not on file  Food Insecurity: Not on file  Transportation Needs: Not on file  Physical Activity: Not on file  Stress: Not on file  Social Connections: Not on file    Family History  Problem Relation Age of Onset   Hypertension Mother    Asthma Mother    Hypertension Father     Health Maintenance  Topic Date Due   DTaP/Tdap/Td (2 - Tdap) 12/27/2013   Medicare Annual Wellness (AWV)  03/29/2017   Lung Cancer Screening  03/28/2022   Pneumonia Vaccine 7+ Years old (2 - PPSV23 or PCV20) 11/01/2022 (Originally 06/21/2015)   DEXA SCAN  11/01/2022 (Originally 08/23/2006)   Zoster Vaccines- Shingrix (1 of 2) 02/01/2023 (Originally 08/22/1960)   COVID-19 Vaccine (5 - 2023-24 season) 05/18/2022  INFLUENZA VACCINE  Completed   HPV VACCINES  Aged Out     ----------------------------------------------------------------------------------------------------------------------------------------------------------------------------------------------------------------- Physical Exam BP 124/69 (BP Location: Left Arm, Patient Position: Sitting, Cuff Size: Normal)   Pulse 79   Ht '4\' 11"'$  (1.499 m)   Wt 145 lb (65.8 kg)   SpO2 98%   BMI 29.29 kg/m   Physical Exam Constitutional:      Appearance: Normal appearance.  HENT:     Head: Normocephalic and atraumatic.  Eyes:     General: No scleral icterus. Cardiovascular:     Rate and Rhythm: Normal rate and regular rhythm.  Pulmonary:     Effort: Pulmonary effort is normal.     Breath sounds: Normal breath sounds.  Neurological:      Mental Status: She is alert.  Psychiatric:        Mood and Affect: Mood normal.        Behavior: Behavior normal.     ------------------------------------------------------------------------------------------------------------------------------------------------------------------------------------------------------------------- Assessment and Plan  Essential hypertension Blood pressure is well-controlled at this time.  Recommend continuation of current medications for management of hypertension.  Updated labs ordered.  Hypothyroidism She feels good with current dose of levothyroxine.  Updating TSH.  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  Restarting Prolia.  Checking vitamin D levels.  Hyperlipidemia with target LDL less than 100 Updated lipid panel today.   No orders of the defined types were placed in this encounter.   Return for Schedule Annual Medicare Wellness Visit.    This visit occurred during the SARS-CoV-2 public health emergency.  Safety protocols were in place, including screening questions prior to the visit, additional usage of staff PPE, and extensive cleaning of exam room while observing appropriate contact time as indicated for disinfecting solutions.

## 2022-05-03 NOTE — Assessment & Plan Note (Signed)
Blood pressure is well-controlled at this time.  Recommend continuation of current medications for management of hypertension.  Updated labs ordered.

## 2022-05-04 LAB — COMPLETE METABOLIC PANEL WITH GFR
AG Ratio: 1.5 (calc) (ref 1.0–2.5)
ALT: 19 U/L (ref 6–29)
AST: 23 U/L (ref 10–35)
Albumin: 4.5 g/dL (ref 3.6–5.1)
Alkaline phosphatase (APISO): 114 U/L (ref 37–153)
BUN: 12 mg/dL (ref 7–25)
CO2: 33 mmol/L — ABNORMAL HIGH (ref 20–32)
Calcium: 10.1 mg/dL (ref 8.6–10.4)
Chloride: 102 mmol/L (ref 98–110)
Creat: 0.61 mg/dL (ref 0.60–0.95)
Globulin: 3.1 g/dL (calc) (ref 1.9–3.7)
Glucose, Bld: 87 mg/dL (ref 65–99)
Potassium: 4.5 mmol/L (ref 3.5–5.3)
Sodium: 141 mmol/L (ref 135–146)
Total Bilirubin: 0.6 mg/dL (ref 0.2–1.2)
Total Protein: 7.6 g/dL (ref 6.1–8.1)
eGFR: 90 mL/min/{1.73_m2} (ref >=60)

## 2022-05-04 LAB — CBC WITH DIFFERENTIAL/PLATELET
Absolute Monocytes: 475 cells/uL (ref 200–950)
Basophils Absolute: 72 cells/uL (ref 0–200)
Basophils Relative: 1 %
Eosinophils Absolute: 151 cells/uL (ref 15–500)
Eosinophils Relative: 2.1 %
HCT: 42.7 % (ref 35.0–45.0)
Hemoglobin: 14.4 g/dL (ref 11.7–15.5)
Lymphs Abs: 2153 cells/uL (ref 850–3900)
MCH: 31.1 pg (ref 27.0–33.0)
MCHC: 33.7 g/dL (ref 32.0–36.0)
MCV: 92.2 fL (ref 80.0–100.0)
MPV: 9.7 fL (ref 7.5–12.5)
Monocytes Relative: 6.6 %
Neutro Abs: 4349 cells/uL (ref 1500–7800)
Neutrophils Relative %: 60.4 %
Platelets: 368 10*3/uL (ref 140–400)
RBC: 4.63 10*6/uL (ref 3.80–5.10)
RDW: 12.1 % (ref 11.0–15.0)
Total Lymphocyte: 29.9 %
WBC: 7.2 10*3/uL (ref 3.8–10.8)

## 2022-05-04 LAB — LIPID PANEL W/REFLEX DIRECT LDL
Cholesterol: 219 mg/dL — ABNORMAL HIGH (ref <200)
HDL: 79 mg/dL (ref >=50)
LDL Cholesterol (Calc): 120 mg/dL (calc) — ABNORMAL HIGH
Non-HDL Cholesterol (Calc): 140 mg/dL (calc) — ABNORMAL HIGH (ref <130)
Total CHOL/HDL Ratio: 2.8 (calc) (ref <5.0)
Triglycerides: 102 mg/dL (ref <150)

## 2022-05-04 LAB — TSH: TSH: 4.66 mIU/L — ABNORMAL HIGH (ref 0.40–4.50)

## 2022-05-04 LAB — VITAMIN D 25 HYDROXY (VIT D DEFICIENCY, FRACTURES): Vit D, 25-Hydroxy: 38 ng/mL (ref 30–100)

## 2022-05-08 ENCOUNTER — Other Ambulatory Visit: Payer: Self-pay | Admitting: Family Medicine

## 2022-05-08 DIAGNOSIS — E039 Hypothyroidism, unspecified: Secondary | ICD-10-CM

## 2022-05-08 MED ORDER — LEVOTHYROXINE SODIUM 88 MCG PO TABS: 88.0000 ug | ORAL_TABLET | Freq: Every day | ORAL | 3 refills | Status: DC

## 2022-05-08 MED ORDER — LEVOTHYROXINE SODIUM 88 MCG PO TABS: 88.0000 ug | ORAL_TABLET | Freq: Every day | ORAL | 1 refills | Status: DC

## 2022-05-08 MED ORDER — SYNTHROID 88 MCG PO TABS: 88.0000 ug | ORAL_TABLET | Freq: Every day | ORAL | 1 refills | Status: DC

## 2022-06-09 ENCOUNTER — Telehealth: Payer: Self-pay

## 2022-06-09 NOTE — Telephone Encounter (Signed)
Pt lvm requesting medication for the following symptoms:  Fever 101, possible flu symptoms.   Front Desk: Please call patient and offer appt or Urgent Care. Thanks

## 2022-06-15 DIAGNOSIS — B349 Viral infection, unspecified: Secondary | ICD-10-CM | POA: Insufficient documentation

## 2022-06-17 DIAGNOSIS — R531 Weakness: Secondary | ICD-10-CM | POA: Insufficient documentation

## 2022-06-19 ENCOUNTER — Other Ambulatory Visit: Payer: Self-pay | Admitting: Family Medicine

## 2022-06-19 ENCOUNTER — Telehealth: Payer: Self-pay

## 2022-06-19 ENCOUNTER — Telehealth: Payer: Self-pay | Admitting: General Practice

## 2022-06-19 NOTE — Telephone Encounter (Signed)
Transition Care Management Follow-up Telephone Call Date of discharge and from where: 06/17/22 from [facility] How have you been since you were released from the hospital? Patient stated that she is doing better however she is in rehab. She is hoping to go back home soon. Patient will call back to make an appointment once she is discharged from [facility]. Any questions or concerns? No  Items Reviewed: Did the pt receive and understand the discharge instructions provided? Yes  Medications obtained and verified? Yes  Other? No  Any new allergies since your discharge? No  Dietary orders reviewed? Yes Do you have support at home? Yes   Home Care and Equipment/Supplies: Were home health services ordered? no   Functional Questionnaire: (I = Independent and D = Dependent) ADLs: I  Bathing/Dressing- I  Meal Prep- I  Eating- I  Maintaining continence- I  Transferring/Ambulation- I  Managing Meds- I  Follow up appointments reviewed:  PCP Hospital f/u appt confirmed? No  Patient is currently in rehab. She said that she will call back to make an appointment.  [facility] f/u appt confirmed? No   Are transportation arrangements needed? No  If their condition worsens, is the pt aware to call PCP or go to the Emergency Dept.? Yes Was the patient provided with contact information for the PCP's office or ED? Yes Was to pt encouraged to call back with questions or concerns? Yes

## 2022-06-19 NOTE — Telephone Encounter (Signed)
Patient is in rehab facility Allison Zuniga, [place] Direct Dial [phone]

## 2022-06-30 ENCOUNTER — Telehealth: Payer: Self-pay

## 2022-06-30 NOTE — Telephone Encounter (Signed)
Rcvd VM from [facility] requesting VO's for PT  for leg strengthening. 1wk x 8  Please advise.

## 2022-07-02 NOTE — Telephone Encounter (Signed)
[place] for VO.   Thanks!  CM

## 2022-07-03 ENCOUNTER — Telehealth: Payer: Self-pay

## 2022-07-03 NOTE — Telephone Encounter (Addendum)
Rcvd vm from [facility] requesting VO for OT 1 x 4wk.  [phone]   Please advise.

## 2022-07-03 NOTE — Telephone Encounter (Signed)
Ok to provide VO.   Thanks!  CM

## 2022-07-04 NOTE — Telephone Encounter (Signed)
LVM granting VO's for OT per Dr. Zigmund Daniel approval.

## 2022-07-05 ENCOUNTER — Telehealth: Payer: Self-pay | Admitting: Family Medicine

## 2022-07-05 NOTE — Telephone Encounter (Signed)
Called patient to schedule Medicare Annual Wellness Visit (AWV). Left message for patient to call back and schedule Medicare Annual Wellness Visit (AWV).  Last date of AWV: Never  Please schedule an appointment at any time with Nurse Health Advisor.  If any questions, please contact me at [phone].  Thank you ,  Lin Givens Patient Access Advocate II Direct Dial: [phone]

## 2022-07-11 ENCOUNTER — Encounter: Payer: Self-pay | Admitting: Family Medicine

## 2022-07-11 ENCOUNTER — Ambulatory Visit (INDEPENDENT_AMBULATORY_CARE_PROVIDER_SITE_OTHER): Payer: Medicare Other | Admitting: Family Medicine

## 2022-07-11 VITALS — BP 155/73 | HR 82 | Ht 59.0 in | Wt 142.0 lb

## 2022-07-11 DIAGNOSIS — S22000S Wedge compression fracture of unspecified thoracic vertebra, sequela: Secondary | ICD-10-CM

## 2022-07-11 DIAGNOSIS — M459 Ankylosing spondylitis of unspecified sites in spine: Secondary | ICD-10-CM

## 2022-07-11 DIAGNOSIS — I2693 Single subsegmental pulmonary embolism without acute cor pulmonale: Secondary | ICD-10-CM | POA: Diagnosis not present

## 2022-07-11 DIAGNOSIS — E559 Vitamin D deficiency, unspecified: Secondary | ICD-10-CM | POA: Diagnosis not present

## 2022-07-11 DIAGNOSIS — E039 Hypothyroidism, unspecified: Secondary | ICD-10-CM

## 2022-07-11 DIAGNOSIS — R209 Unspecified disturbances of skin sensation: Secondary | ICD-10-CM | POA: Diagnosis not present

## 2022-07-11 DIAGNOSIS — I10 Essential (primary) hypertension: Secondary | ICD-10-CM

## 2022-07-11 NOTE — Assessment & Plan Note (Signed)
Continues on methotrexate for management of this.  Management per rheumatology.

## 2022-07-11 NOTE — Assessment & Plan Note (Signed)
Blood pressure is elevated today.  Recommend that she monitor this closely at home.

## 2022-07-11 NOTE — Assessment & Plan Note (Signed)
Updating B12 levels.

## 2022-07-11 NOTE — Assessment & Plan Note (Signed)
Multiple compression fractures of the spine.  Continues to see Dr. Rolena Infante for management and surveillance.

## 2022-07-11 NOTE — Assessment & Plan Note (Signed)
Update TSH

## 2022-07-11 NOTE — Assessment & Plan Note (Signed)
Currently on Eliquis.  Doing well with his so far.  We discussed that she should remain on this for a minimum of 3 months for provoked pulmonary embolism.

## 2022-07-11 NOTE — Progress Notes (Signed)
Allison Zuniga - 82 y.o. female MRN [id number]  Date of birth: [birth year]  Subjective Chief Complaint  Patient presents with   Hospitalization Follow-up   Constipation    HPI Allison Zuniga is a an 82 year old female here today for hospital follow-up.  She was initially transported to the ED with altered mental status.  Admitted for acute metabolic encephalopathy.  She had workup for CVA which was negative.  She had symptoms of viral syndrome for couple days preceding her admission.  Testing for COVID and flu were negative.  There was some question about acute cholecystitis admission but lab work and imaging studies did not support this diagnosis.  It was felt that her symptoms were related to a viral illness.  She does not have any significant leukocytosis on admission.  Her methotrexate was held on admission due to concern for infectious disease.  She has restarted this at time of discharge.  She was discharged to skilled nursing rehab due to significant deconditioning and generalized weakness.  She has gotten quite a bit of her strength back but continues with home health physical therapy.  She does have home health nursing as well.    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  She was started on Eliquis.  Overall tolerating this pretty well.  Denies increased dyspnea.  She has history of multiple spinal fractures.  She does have an appointment with her spine surgeon later this week.  She continues to have difficulty with neuropathy.  She has restarted her home treatments for neuropathy and continues on Lyrica.  ROS:  A comprehensive ROS was completed and negative except as noted per HPI  Allergies  Allergen Reactions   Sulfa Antibiotics Other (See Comments)    Chest tightness   Aspirin Hives   Penicillins Hives    Past Medical History:  Diagnosis Date   COPD (chronic obstructive pulmonary disease) (HCC)    Former smoker    Hypertension     Hypothyroidism    Pneumonia    > 10 years ago per pt     Past Surgical History:  Procedure Laterality Date   EYE SURGERY     bilateral cataract removal 2021 per pt    KYPHOPLASTY N/A 09/24/2019   Procedure: THORACIC TWELVE, LUMBAR ONE KYPHOPLASTY;  Surgeon: Melina Schools, MD;  Location: [facility];  Service: Orthopedics;  Laterality: N/A;  46 ins    Social History   Socioeconomic History   Marital status: Married    Spouse name: Besty Zuniga   Number of children: Not on file   Years of education: Not on file   Highest education level: Not on file  Occupational History   Not on file  Tobacco Use   Smoking status: Former    Packs/day: 1.00    Years: 30.00    Total pack years: 30.00    Types: Cigarettes    Quit date: 2015    Years since quitting: 9.1   Smokeless tobacco: Former  Scientific laboratory technician Use: Never used  Substance and Sexual Activity   Alcohol use: Yes    Alcohol/week: 1.0 - 2.0 standard drink of alcohol    Types: 1 - 2 Standard drinks or equivalent per week   Drug use: Never   Sexual activity: Not Currently  Other Topics Concern   Not on file  Social History Narrative   Not on file   Social Determinants of Health   Financial Resource Strain: Not on  file  Food Insecurity: Not on file  Transportation Needs: Not on file  Physical Activity: Not on file  Stress: Not on file  Social Connections: Not on file    Family History  Problem Relation Age of Onset   Hypertension Mother    Asthma Mother    Hypertension Father     Health Maintenance  Topic Date Due   DTaP/Tdap/Td (2 - Tdap) 12/27/2013   Medicare Annual Wellness (AWV)  03/29/2017   Lung Cancer Screening  03/28/2022   COVID-19 Vaccine (5 - 2023-24 season) 05/18/2022   Pneumonia Vaccine 1+ Years old (2 of 2 - PPSV23 or PCV20) 11/01/2022 (Originally 06/21/2015)   DEXA SCAN  11/01/2022 (Originally 08/23/2006)   Zoster Vaccines- Shingrix (1 of 2) 02/01/2023 (Originally 08/22/1960)   INFLUENZA  VACCINE  Completed   HPV VACCINES  Aged Out     ----------------------------------------------------------------------------------------------------------------------------------------------------------------------------------------------------------------- Physical Exam BP (!) 155/73 (BP Location: Right Arm, Patient Position: Sitting, Cuff Size: Small)   Pulse 82   Ht '4\' 11"'$  (1.499 m)   Wt 142 lb (64.4 kg)   SpO2 99%   BMI 28.68 kg/m   Physical Exam Constitutional:      Appearance: Normal appearance.  HENT:     Head: Normocephalic and atraumatic.  Eyes:     General: No scleral icterus. Cardiovascular:     Rate and Rhythm: Normal rate and regular rhythm.  Pulmonary:     Effort: Pulmonary effort is normal.     Breath sounds: Normal breath sounds.  Neurological:     Mental Status: She is alert.  Psychiatric:        Mood and Affect: Mood normal.        Behavior: Behavior normal.     ------------------------------------------------------------------------------------------------------------------------------------------------------------------------------------------------------------------- Assessment and Plan  Essential hypertension Blood pressure is elevated today.  Recommend that she monitor this closely at home.  Hypothyroidism Update TSH.  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  Management per rheumatology.  Compression fracture of thoracic vertebra (HCC) Multiple compression fractures of the spine.  Continues to see Dr. Rolena Infante for management and surveillance.  Disturbance of skin sensation Updating B12 levels.  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  Doing well with his so far.  We discussed that she should remain on this for a minimum of 3 months for provoked pulmonary embolism.   No orders of the defined types were placed in this encounter.   Return in about  3 months (around 10/09/2022) for DVT/PE.    This visit occurred during the SARS-CoV-2 public health emergency.  Safety protocols were in place, including screening questions prior to the visit, additional usage of staff PPE, and extensive cleaning of exam room while observing appropriate contact time as indicated for disinfecting solutions.

## 2022-07-12 ENCOUNTER — Encounter: Payer: Self-pay | Admitting: Family Medicine

## 2022-07-12 ENCOUNTER — Other Ambulatory Visit: Payer: Self-pay

## 2022-07-12 LAB — CBC WITH DIFFERENTIAL/PLATELET
Absolute Monocytes: 615 cells/uL (ref 200–950)
Basophils Absolute: 49 cells/uL (ref 0–200)
Basophils Relative: 0.6 %
Eosinophils Absolute: 74 cells/uL (ref 15–500)
Eosinophils Relative: 0.9 %
HCT: 44 % (ref 35.0–45.0)
Hemoglobin: 14.4 g/dL (ref 11.7–15.5)
Lymphs Abs: 1714 cells/uL (ref 850–3900)
MCH: 30.6 pg (ref 27.0–33.0)
MCHC: 32.7 g/dL (ref 32.0–36.0)
MCV: 93.4 fL (ref 80.0–100.0)
MPV: 9.3 fL (ref 7.5–12.5)
Monocytes Relative: 7.5 %
Neutro Abs: 5748 cells/uL (ref 1500–7800)
Neutrophils Relative %: 70.1 %
Platelets: 424 10*3/uL — ABNORMAL HIGH (ref 140–400)
RBC: 4.71 10*6/uL (ref 3.80–5.10)
RDW: 13.1 % (ref 11.0–15.0)
Total Lymphocyte: 20.9 %
WBC: 8.2 10*3/uL (ref 3.8–10.8)

## 2022-07-12 LAB — BASIC METABOLIC PANEL
BUN/Creatinine Ratio: 23 (calc) — ABNORMAL HIGH (ref 6–22)
BUN: 13 mg/dL (ref 7–25)
CO2: 30 mmol/L (ref 20–32)
Calcium: 10.6 mg/dL — ABNORMAL HIGH (ref 8.6–10.4)
Chloride: 98 mmol/L (ref 98–110)
Creat: 0.56 mg/dL — ABNORMAL LOW (ref 0.60–0.95)
Glucose, Bld: 93 mg/dL (ref 65–99)
Potassium: 4.3 mmol/L (ref 3.5–5.3)
Sodium: 137 mmol/L (ref 135–146)

## 2022-07-12 LAB — VITAMIN B12: Vitamin B-12: 1543 pg/mL — ABNORMAL HIGH (ref 200–1100)

## 2022-07-12 LAB — TSH: TSH: 1.28 mIU/L (ref 0.40–4.50)

## 2022-07-12 LAB — VITAMIN D 25 HYDROXY (VIT D DEFICIENCY, FRACTURES): Vit D, 25-Hydroxy: 42 ng/mL (ref 30–100)

## 2022-07-12 MED ORDER — ELIQUIS 5 MG PO TABS: 5.0000 mg | ORAL_TABLET | Freq: Two times a day (BID) | ORAL | 1 refills | Status: DC

## 2022-07-14 ENCOUNTER — Telehealth: Payer: Self-pay

## 2022-07-14 NOTE — Telephone Encounter (Signed)
Pt lvm requesting lab result clarification.   No results notes as of yet from Dr. Zigmund Daniel.

## 2022-07-18 NOTE — Telephone Encounter (Signed)
Task completed. Per Dr. Zigmund Daniel Mychart msg:  "I think the B12 levels are ok.  Platelets are a little elevated.  These can act as a marker of inflammation and is likely residual of what she has been through over the past few weeks.  We'll recheck this again in 3-4 months to see if this is trending back down".    CM

## 2022-08-17 ENCOUNTER — Encounter: Payer: Self-pay | Admitting: Family Medicine

## 2022-08-17 ENCOUNTER — Ambulatory Visit (INDEPENDENT_AMBULATORY_CARE_PROVIDER_SITE_OTHER): Payer: Medicare Other | Admitting: Family Medicine

## 2022-08-17 VITALS — BP 155/77 | HR 79 | Ht 59.0 in | Wt 141.0 lb

## 2022-08-17 DIAGNOSIS — R309 Painful micturition, unspecified: Secondary | ICD-10-CM | POA: Diagnosis not present

## 2022-08-17 DIAGNOSIS — N3000 Acute cystitis without hematuria: Secondary | ICD-10-CM | POA: Diagnosis not present

## 2022-08-17 LAB — POCT URINALYSIS DIP (CLINITEK)
Bilirubin, UA: NEGATIVE
Glucose, UA: NEGATIVE mg/dL
Ketones, POC UA: NEGATIVE mg/dL
Nitrite, UA: POSITIVE — AB
Spec Grav, UA: >=1.03 — AB (ref 1.010–1.025)
Urobilinogen, UA: 0.2 E.U./dL
pH, UA: 6 (ref 5.0–8.0)

## 2022-08-17 MED ORDER — NITROFURANTOIN MONOHYD MACRO 100 MG PO CAPS: 100.0000 mg | ORAL_CAPSULE | Freq: Two times a day (BID) | ORAL | 0 refills | Status: DC

## 2022-08-17 NOTE — Patient Instructions (Signed)
Start nitrofurantoin.  Let me know if symptoms worsen.  We'll be in touch with culture results.

## 2022-08-17 NOTE — Assessment & Plan Note (Signed)
UA consistent with UTI. Treating with course of nitrofurantoin.  Urine sent for culture and will adjust treatment if needed.  Red flags reviewed.

## 2022-08-17 NOTE — Progress Notes (Signed)
Allison Zuniga - 82 y.o. female MRN [id number]  Date of birth: 04-08-1941  Subjective Chief Complaint  Patient presents with   Urinary Tract Infection    HPI Allison Zuniga is a 82 y.o. female here today with complaint of dysuria.  Symptoms started a couple of days ago.  She does have a little frequency.  Denies fever, chills, flank pain, hematuria.  She is drinking a good amount of fluids.    ROS:  A comprehensive ROS was completed and negative except as noted per HPI  Allergies  Allergen Reactions   Sulfa Antibiotics Other (See Comments)    Chest tightness   Aspirin Hives   Penicillins Hives    Past Medical History:  Diagnosis Date   COPD (chronic obstructive pulmonary disease)    Former smoker    Hypertension    Hypothyroidism    Pneumonia    > 10 years ago per pt     Past Surgical History:  Procedure Laterality Date   EYE SURGERY     bilateral cataract removal 2021 per pt    KYPHOPLASTY N/A 09/24/2019   Procedure: THORACIC TWELVE, LUMBAR ONE KYPHOPLASTY;  Surgeon: Melina Schools, MD;  Location: [facility];  Service: Orthopedics;  Laterality: N/A;  43 ins    Social History   Socioeconomic History   Marital status: Married    Spouse name: Ciaran Imdieke   Number of children: Not on file   Years of education: Not on file   Highest education level: Not on file  Occupational History   Not on file  Tobacco Use   Smoking status: Former    Packs/day: 1.00    Years: 30.00    Additional pack years: 0.00    Total pack years: 30.00    Types: Cigarettes    Quit date: 2015    Years since quitting: 9.2   Smokeless tobacco: Former  Scientific laboratory technician Use: Never used  Substance and Sexual Activity   Alcohol use: Yes    Alcohol/week: 1.0 - 2.0 standard drink of alcohol    Types: 1 - 2 Standard drinks or equivalent per week   Drug use: Never   Sexual activity: Not Currently  Other Topics Concern   Not on file  Social History Narrative   Not on file   Social  Determinants of Health   Financial Resource Strain: Not on file  Food Insecurity: Not on file  Transportation Needs: Not on file  Physical Activity: Not on file  Stress: Not on file  Social Connections: Not on file    Family History  Problem Relation Age of Onset   Hypertension Mother    Asthma Mother    Hypertension Father     Health Maintenance  Topic Date Due   DTaP/Tdap/Td (2 - Tdap) 12/27/2013   Medicare Annual Wellness (AWV)  03/29/2017   Lung Cancer Screening  03/28/2022   COVID-19 Vaccine (5 - 2023-24 season) 05/18/2022   Pneumonia Vaccine 37+ Years old (2 of 2 - PPSV23 or PCV20) 11/01/2022 (Originally 06/21/2015)   DEXA SCAN  11/01/2022 (Originally 08/23/2006)   Zoster Vaccines- Shingrix (1 of 2) 02/01/2023 (Originally 08/22/1960)   INFLUENZA VACCINE  12/14/2022   HPV VACCINES  Aged Out     ----------------------------------------------------------------------------------------------------------------------------------------------------------------------------------------------------------------- Physical Exam BP (!) 155/77 (BP Location: Left Arm, Patient Position: Sitting, Cuff Size: Normal)   Pulse 79   Ht 4\' 11"  (1.499 m)   Wt 141 lb (64 kg)   SpO2 96%   BMI 28.48  kg/m   Physical Exam Constitutional:      Appearance: Normal appearance.  Neurological:     Mental Status: She is alert.  Psychiatric:        Mood and Affect: Mood normal.        Behavior: Behavior normal.     ------------------------------------------------------------------------------------------------------------------------------------------------------------------------------------------------------------------- Assessment and Plan  Acute cystitis UA consistent with UTI. Treating with course of nitrofurantoin.  Urine sent for culture and will adjust treatment if needed.  Red flags reviewed.    No orders of the defined types were placed in this encounter.   No follow-ups on  file.    This visit occurred during the SARS-CoV-2 public health emergency.  Safety protocols were in place, including screening questions prior to the visit, additional usage of staff PPE, and extensive cleaning of exam room while observing appropriate contact time as indicated for disinfecting solutions.

## 2022-08-19 LAB — URINE CULTURE
MICRO NUMBER:: 14783145
SPECIMEN QUALITY:: ADEQUATE

## 2022-08-26 ENCOUNTER — Other Ambulatory Visit: Payer: Self-pay | Admitting: Family Medicine

## 2022-09-17 ENCOUNTER — Other Ambulatory Visit: Payer: Self-pay | Admitting: Family Medicine

## 2022-09-18 NOTE — Telephone Encounter (Signed)
Pt called. She wants a referral to have her Bone Density test done.

## 2022-09-19 MED ORDER — PREGABALIN 50 MG PO CAPS: 50.0000 mg | ORAL_CAPSULE | Freq: Two times a day (BID) | ORAL | 0 refills | Status: DC

## 2022-09-19 NOTE — Addendum Note (Signed)
Addended by: Ardyth Man on: 09/19/2022 11:45 AM   Modules accepted: Orders

## 2022-09-19 NOTE — Addendum Note (Signed)
Addended by: Mammie Lorenzo on: 09/19/2022 05:32 PM   Modules accepted: Orders

## 2022-09-20 ENCOUNTER — Other Ambulatory Visit: Payer: Self-pay

## 2022-09-26 ENCOUNTER — Telehealth: Payer: Self-pay | Admitting: Family Medicine

## 2022-09-26 DIAGNOSIS — Z13820 Encounter for screening for osteoporosis: Secondary | ICD-10-CM

## 2022-09-26 DIAGNOSIS — Z780 Asymptomatic menopausal state: Secondary | ICD-10-CM

## 2022-09-26 NOTE — Telephone Encounter (Signed)
Patient called she is requesting a bone density test please order for our Imaging Dept here

## 2022-09-26 NOTE — Telephone Encounter (Signed)
Orders entered.   Thanks!  CM

## 2022-10-10 ENCOUNTER — Ambulatory Visit (INDEPENDENT_AMBULATORY_CARE_PROVIDER_SITE_OTHER): Payer: Medicare Other | Admitting: Family Medicine

## 2022-10-10 ENCOUNTER — Encounter: Payer: Self-pay | Admitting: Family Medicine

## 2022-10-10 VITALS — BP 119/74 | HR 76 | Ht 59.0 in | Wt 140.0 lb

## 2022-10-10 DIAGNOSIS — R911 Solitary pulmonary nodule: Secondary | ICD-10-CM

## 2022-10-10 DIAGNOSIS — K623 Rectal prolapse: Secondary | ICD-10-CM | POA: Insufficient documentation

## 2022-10-10 DIAGNOSIS — I2693 Single subsegmental pulmonary embolism without acute cor pulmonale: Secondary | ICD-10-CM | POA: Diagnosis not present

## 2022-10-10 NOTE — Patient Instructions (Signed)
Lets hold the TUMS and additional vitamin D as the excess calcium may be causing constipation.  If are on you bottom becomes more irritated let me know.  You can stop the Eliquis at this time.

## 2022-10-10 NOTE — Progress Notes (Signed)
Allison Zuniga - 82 y.o. female MRN [id number]  Date of birth: [birth year]  Subjective Chief Complaint  Patient presents with   Follow-up    HPI Allison Zuniga is an 82 year old female here today for follow-up visit.  She has history of provoked PE and has been on Eliquis for approximately 3 months.  She denies any new or continued symptoms including leg pain or swelling, dyspnea or chest pain.  No side effects of eliquis.  She does have some issues with constipation.  He has noticed a protrusion from her anal area.  She received enema from nurse at rehab facility x 3.  Denies any pain or bleeding associated with protrusion.  Constipation has improved at this point but still has to take MiraLAX.  She is on 2 calcium supplements, multivitamin and vitamin D supplements.  ROS:  A comprehensive ROS was completed and negative except as noted per HPI      Allergies  Allergen Reactions   Sulfa Antibiotics Other (See Comments)    Chest tightness   Aspirin Hives   Penicillins Hives    Past Medical History:  Diagnosis Date   COPD (chronic obstructive pulmonary disease) (HCC)    Former smoker    Hypertension    Hypothyroidism    Pneumonia    > 10 years ago per pt     Past Surgical History:  Procedure Laterality Date   EYE SURGERY     bilateral cataract removal 2021 per pt    KYPHOPLASTY N/A 09/24/2019   Procedure: THORACIC TWELVE, LUMBAR ONE KYPHOPLASTY;  Surgeon: Venita Lick, MD;  Location: [facility];  Service: Orthopedics;  Laterality: N/A;  60 ins    Social History   Socioeconomic History   Marital status: Married    Spouse name: Odessey Braxton   Number of children: Not on file   Years of education: Not on file   Highest education level: Not on file  Occupational History   Not on file  Tobacco Use   Smoking status: Former    Packs/day: 1.00    Years: 30.00    Additional pack years: 0.00    Total pack years: 30.00    Types: Cigarettes    Quit date: 2015    Years since  quitting: 9.4   Smokeless tobacco: Former  Building services engineer Use: Never used  Substance and Sexual Activity   Alcohol use: Yes    Alcohol/week: 1.0 - 2.0 standard drink of alcohol    Types: 1 - 2 Standard drinks or equivalent per week   Drug use: Never   Sexual activity: Not Currently  Other Topics Concern   Not on file  Social History Narrative   Not on file   Social Determinants of Health   Financial Resource Strain: Not on file  Food Insecurity: Not on file  Transportation Needs: Not on file  Physical Activity: Not on file  Stress: Not on file  Social Connections: Not on file    Family History  Problem Relation Age of Onset   Hypertension Mother    Asthma Mother    Hypertension Father     Health Maintenance  Topic Date Due   DTaP/Tdap/Td (2 - Tdap) 12/27/2013   Medicare Annual Wellness (AWV)  03/29/2017   Pneumonia Vaccine 62+ Years old (2 of 2 - PPSV23 or PCV20) 11/01/2022 (Originally 06/21/2015)   DEXA SCAN  11/01/2022 (Originally 08/23/2006)   Zoster Vaccines- Shingrix (1 of 2) 02/01/2023 (Originally 08/22/1960)   COVID-19 Vaccine (5 -  2023-24 season) 02/25/2023 (Originally 05/18/2022)   INFLUENZA VACCINE  12/14/2022   HPV VACCINES  Aged Out   Lung Cancer Screening  Discontinued     ----------------------------------------------------------------------------------------------------------------------------------------------------------------------------------------------------------------- Physical Exam BP 119/74 (BP Location: Right Arm, Patient Position: Sitting, Cuff Size: Normal)   Pulse 76   Ht 4\' 11"  (1.499 m)   Wt 140 lb (63.5 kg)   SpO2 98%   BMI 28.28 kg/m   Physical Exam Constitutional:      Appearance: Normal appearance.  HENT:     Head: Normocephalic and atraumatic.  Eyes:     General: No scleral icterus. Cardiovascular:     Rate and Rhythm: Normal rate and regular rhythm.  Pulmonary:     Effort: Pulmonary effort is normal.     Breath  sounds: Normal breath sounds.  Genitourinary:    Comments: She appears to have a hemorrhoid as well as a small amount of prolapse of the rectum.  No irritation or bleeding noted. Neurological:     General: No focal deficit present.     Mental Status: She is alert.  Psychiatric:        Mood and Affect: Mood normal.        Behavior: Behavior normal.     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  I think she can discontinue this at this point as she is not having any new or continued symptoms.  Lung nodule Will plan to repeat her CT of follow-up of nodule and approximately 3 months.  Rectal mucosa prolapse For swollen prolapse.  No irritation at this time.  This does reduce however we protrudes again after reduction.  She will use barrier creams on this for now and let me know if having any irritation.   No orders of the defined types were placed in this encounter.   Return in about 6 months (around 04/12/2023) for HTN.    This visit occurred during the SARS-CoV-2 public health emergency.  Safety protocols were in place, including screening questions prior to the visit, additional usage of staff PPE, and extensive cleaning of exam room while observing appropriate contact time as indicated for disinfecting solutions.

## 2022-10-10 NOTE — Assessment & Plan Note (Signed)
She has been on 3 months of anticoagulation with Eliquis for Naum provoked pulmonary embolism.  I think she can discontinue this at this point as she is not having any new or continued symptoms.

## 2022-10-10 NOTE — Assessment & Plan Note (Signed)
Will plan to repeat her CT of follow-up of nodule and approximately 3 months.

## 2022-10-10 NOTE — Assessment & Plan Note (Signed)
For swollen prolapse.  No irritation at this time.  This does reduce however we protrudes again after reduction.  She will use barrier creams on this for now and let me know if having any irritation.

## 2022-11-06 ENCOUNTER — Ambulatory Visit: Payer: Medicare Other | Admitting: Family Medicine

## 2022-11-07 ENCOUNTER — Encounter: Payer: Self-pay | Admitting: Family Medicine

## 2022-11-07 ENCOUNTER — Ambulatory Visit (INDEPENDENT_AMBULATORY_CARE_PROVIDER_SITE_OTHER): Payer: Medicare Other | Admitting: Family Medicine

## 2022-11-07 VITALS — BP 159/77 | HR 72 | Resp 18 | Ht 59.0 in | Wt 138.5 lb

## 2022-11-07 DIAGNOSIS — E538 Deficiency of other specified B group vitamins: Secondary | ICD-10-CM | POA: Diagnosis not present

## 2022-11-07 DIAGNOSIS — R300 Dysuria: Secondary | ICD-10-CM

## 2022-11-07 DIAGNOSIS — N3000 Acute cystitis without hematuria: Secondary | ICD-10-CM

## 2022-11-07 DIAGNOSIS — R197 Diarrhea, unspecified: Secondary | ICD-10-CM | POA: Diagnosis not present

## 2022-11-07 DIAGNOSIS — E559 Vitamin D deficiency, unspecified: Secondary | ICD-10-CM | POA: Insufficient documentation

## 2022-11-07 LAB — POCT URINALYSIS DIPSTICK
Bilirubin, UA: NEGATIVE
Blood, UA: NEGATIVE
Glucose, UA: NEGATIVE
Ketones, UA: NEGATIVE
Nitrite, UA: NEGATIVE
Protein, UA: NEGATIVE
Spec Grav, UA: 1.025 (ref 1.010–1.025)
Urobilinogen, UA: 0.2 E.U./dL
pH, UA: 6 (ref 5.0–8.0)

## 2022-11-07 MED ORDER — NITROFURANTOIN MONOHYD MACRO 100 MG PO CAPS: 100.0000 mg | ORAL_CAPSULE | Freq: Two times a day (BID) | ORAL | 0 refills | Status: AC

## 2022-11-07 NOTE — Assessment & Plan Note (Signed)
B12 lab done few months back shows elevated B12 level in the thousands.  Patient is still continuing to take her B12.  We had a long discussion about her stopping this medication.  I will repeat blood work today

## 2022-11-07 NOTE — Assessment & Plan Note (Signed)
Point-of-care urine positive for leukocytes we will go ahead and treat with Macrobid - We will culture

## 2022-11-07 NOTE — Progress Notes (Signed)
Established patient visit   Patient: Allison Zuniga   DOB: May 29, 1940   82 y.o. Female  MRN: [id number] Visit Date: 11/07/2022  Today's healthcare provider: Charlton Amor, DO   Chief Complaint  Patient presents with   Diarrhea    SUBJECTIVE    Chief Complaint  Patient presents with   Diarrhea   HPI   Pt presents for diarrhea mostly at night. She will have 3 episodes of diarrhea at night. This has been present for about 2 weeks. She was previously on antibiotics, specifically clindamycin for a tooth pull. She was on it for 7 days. Stools are brown. Says she is passing a jelly like substance. She says the coloring resembles the color of skin. Denies blood or dark stools. She notes a bad order.   She is having dysuria.  Notes an increase in hesitancy and not completely emptying her bladder.  Denies hematuria.   Review of Systems  Constitutional:  Negative for activity change, fatigue and fever.  Respiratory:  Negative for cough and shortness of breath.   Cardiovascular:  Negative for chest pain.  Gastrointestinal:  Negative for abdominal pain.  Genitourinary:  Negative for difficulty urinating.       Current Meds  Medication Sig   albuterol (VENTOLIN HFA) 108 (90 Base) MCG/ACT inhaler Inhale 1 puff into the lungs every 6 (six) hours as needed for wheezing or shortness of breath.   atenolol (TENORMIN) 50 MG tablet Take 1 tablet by mouth twice daily   cyanocobalamin 100 MCG tablet Take 1,000 mcg by mouth daily.   denosumab (PROLIA) 60 MG/ML SOSY injection Inject 60 mg into the skin every 6 (six) months.   ELIQUIS 5 MG TABS tablet Take 1 tablet by mouth twice daily   Fluticasone-Umeclidin-Vilant (TRELEGY ELLIPTA) 100-62.5-25 MCG/ACT AEPB INHALE 1 PUFF ONCE DAILY   folic acid (FOLVITE) 1 MG tablet Take 2 mg by mouth daily.   irbesartan (AVAPRO) 150 MG tablet Take 1 tablet (150 mg total) by mouth daily.   methotrexate 2.5 MG tablet Take 15 mg by mouth every Monday.    nitrofurantoin, macrocrystal-monohydrate, (MACROBID) 100 MG capsule Take 1 capsule (100 mg total) by mouth 2 (two) times daily for 5 days.   OVER THE COUNTER MEDICATION Take 500 mg by mouth 2 (two) times daily. Citracel D3   pregabalin (LYRICA) 50 MG capsule Take 1 capsule (50 mg total) by mouth 2 (two) times daily.   SYNTHROID 88 MCG tablet Take 1 tablet (88 mcg total) by mouth daily before breakfast.    OBJECTIVE    BP (!) 159/77 (BP Location: Left Arm, Patient Position: Sitting, Cuff Size: Normal)   Pulse 72   Resp 18   Ht 4\' 11"  (1.499 m)   Wt 138 lb 8 oz (62.8 kg)   SpO2 100%   BMI 27.97 kg/m   Physical Exam Vitals and nursing note reviewed.  Constitutional:      General: She is not in acute distress.    Appearance: Normal appearance.  HENT:     Head: Normocephalic and atraumatic.     Right Ear: External ear normal.     Left Ear: External ear normal.     Nose: Nose normal.  Eyes:     Conjunctiva/sclera: Conjunctivae normal.  Cardiovascular:     Rate and Rhythm: Normal rate and regular rhythm.  Pulmonary:     Effort: Pulmonary effort is normal.     Breath sounds: Normal breath sounds.  Abdominal:  General: Abdomen is flat.     Palpations: Abdomen is soft.     Comments: Hyperactive bowel sounds  Neurological:     General: No focal deficit present.     Mental Status: She is alert and oriented to person, place, and time.  Psychiatric:        Mood and Affect: Mood normal.        Behavior: Behavior normal.        Thought Content: Thought content normal.        Judgment: Judgment normal.        ASSESSMENT & PLAN    Problem List Items Addressed This Visit       Genitourinary   Acute cystitis    Point-of-care urine positive for leukocytes we will go ahead and treat with Macrobid - We will culture        Other   Diarrhea - Primary    Patient presents with diarrhea for about 2 to 3 weeks now.  Does have a history of recent antibiotic use specifically  clindamycin.  Will go ahead and get stool studies as well as test for C. difficile.  Diarrhea is improving per patient report.  I am wondering if this was diarrhea secondary to antibiotic use versus gastroenteritis. -Will also order blood work to assess dehydration status since daughter says that mom has been having a lot of diarrhea.      Relevant Orders   C. difficile GDH and Toxin A/B   COMPLETE METABOLIC PANEL WITH GFR   Magnesium   CBC   Stool Culture   Ova and parasite examination   E. histolytica antibody (amoeba ab)   Giardia antigen   Fecal lactoferrin, quant   B12 deficiency    B12 lab done few months back shows elevated B12 level in the thousands.  Patient is still continuing to take her B12.  We had a long discussion about her stopping this medication.  I will repeat blood work today      Relevant Orders   Vitamin B12   Vitamin D deficiency    Would like to have vitamin D level drawn today.  Will go ahead and order patient does have a history of vitamin D deficiency and has not been taking her supplement      Relevant Orders   Vitamin D (25 hydroxy)   Other Visit Diagnoses     Dysuria       Relevant Orders   POCT urinalysis dipstick (Completed)      40 minutes was spent with the patient in the exam room  No follow-ups on file.      Meds ordered this encounter  Medications   nitrofurantoin, macrocrystal-monohydrate, (MACROBID) 100 MG capsule    Sig: Take 1 capsule (100 mg total) by mouth 2 (two) times daily for 5 days.    Dispense:  10 capsule    Refill:  0    Orders Placed This Encounter  Procedures   Stool Culture   Ova and parasite examination   Giardia antigen   C. difficile GDH and Toxin A/B   COMPLETE METABOLIC PANEL WITH GFR   Magnesium   CBC   Vitamin D (25 hydroxy)   Vitamin B12   E. histolytica antibody (amoeba ab)   Fecal lactoferrin, quant   POCT urinalysis dipstick     Charlton Amor, DO  [facility]  Medicine at [facility] [phone] (phone) [phone] (fax)  [facility]

## 2022-11-07 NOTE — Assessment & Plan Note (Signed)
Patient presents with diarrhea for about 2 to 3 weeks now.  Does have a history of recent antibiotic use specifically clindamycin.  Will go ahead and get stool studies as well as test for C. difficile.  Diarrhea is improving per patient report.  I am wondering if this was diarrhea secondary to antibiotic use versus gastroenteritis. -Will also order blood work to assess dehydration status since daughter says that mom has been having a lot of diarrhea.

## 2022-11-07 NOTE — Assessment & Plan Note (Signed)
Would like to have vitamin D level drawn today.  Will go ahead and order patient does have a history of vitamin D deficiency and has not been taking her supplement

## 2022-11-07 NOTE — Addendum Note (Signed)
Addended by: Ardyth Man on: 11/07/2022 04:09 PM   Modules accepted: Orders

## 2022-11-07 NOTE — Patient Instructions (Signed)
STOP B12   Take nitrofurantoin for UTI with greek yogurt  Take stool sample and bring back to [place]   Blood work ordered today to look at your magnesium level

## 2022-11-08 ENCOUNTER — Ambulatory Visit (INDEPENDENT_AMBULATORY_CARE_PROVIDER_SITE_OTHER): Payer: Medicare Other

## 2022-11-08 DIAGNOSIS — Z780 Asymptomatic menopausal state: Secondary | ICD-10-CM | POA: Diagnosis not present

## 2022-11-08 DIAGNOSIS — Z13820 Encounter for screening for osteoporosis: Secondary | ICD-10-CM | POA: Diagnosis not present

## 2022-11-08 LAB — URINE CULTURE
MICRO NUMBER:: 15124811
SPECIMEN QUALITY:: ADEQUATE

## 2022-11-09 LAB — CBC
HCT: 40.1 % (ref 35.0–45.0)
Hemoglobin: 13.1 g/dL (ref 11.7–15.5)
MCH: 30.6 pg (ref 27.0–33.0)
MCHC: 32.7 g/dL (ref 32.0–36.0)
MCV: 93.7 fL (ref 80.0–100.0)
MPV: 10.1 fL (ref 7.5–12.5)
Platelets: 381 Thousand/uL (ref 140–400)
RBC: 4.28 Million/uL (ref 3.80–5.10)
RDW: 12 % (ref 11.0–15.0)
WBC: 11.6 Thousand/uL — ABNORMAL HIGH (ref 3.8–10.8)

## 2022-11-09 LAB — COMPLETE METABOLIC PANEL WITHOUT GFR
AG Ratio: 1.4 (calc) (ref 1.0–2.5)
ALT: 13 U/L (ref 6–29)
AST: 15 U/L (ref 10–35)
Albumin: 4 g/dL (ref 3.6–5.1)
Alkaline phosphatase (APISO): 98 U/L (ref 37–153)
BUN/Creatinine Ratio: 20 (calc) (ref 6–22)
BUN: 12 mg/dL (ref 7–25)
CO2: 29 mmol/L (ref 20–32)
Calcium: 9.5 mg/dL (ref 8.6–10.4)
Chloride: 101 mmol/L (ref 98–110)
Creat: 0.59 mg/dL — ABNORMAL LOW (ref 0.60–0.95)
Globulin: 2.9 g/dL (ref 1.9–3.7)
Glucose, Bld: 81 mg/dL (ref 65–99)
Potassium: 4.5 mmol/L (ref 3.5–5.3)
Sodium: 141 mmol/L (ref 135–146)
Total Bilirubin: 0.3 mg/dL (ref 0.2–1.2)
Total Protein: 6.9 g/dL (ref 6.1–8.1)
eGFR: 90 mL/min/1.73m2

## 2022-11-09 LAB — VITAMIN D 25 HYDROXY (VIT D DEFICIENCY, FRACTURES): Vit D, 25-Hydroxy: 50 ng/mL (ref 30–100)

## 2022-11-09 LAB — MAGNESIUM: Magnesium: 1.7 mg/dL (ref 1.5–2.5)

## 2022-11-09 LAB — VITAMIN B12: Vitamin B-12: 1564 pg/mL — ABNORMAL HIGH (ref 200–1100)

## 2022-11-10 ENCOUNTER — Telehealth: Payer: Self-pay

## 2022-11-10 DIAGNOSIS — A0472 Enterocolitis due to Clostridium difficile, not specified as recurrent: Secondary | ICD-10-CM

## 2022-11-10 LAB — GIARDIA ANTIGEN
MICRO NUMBER:: 15139410
RESULT:: NOT DETECTED

## 2022-11-10 MED ORDER — VANCOMYCIN HCL 125 MG PO CAPS
125.0000 mg | ORAL_CAPSULE | Freq: Four times a day (QID) | ORAL | 0 refills | Status: AC
Start: 2022-11-10 — End: 2022-11-20

## 2022-11-10 NOTE — Telephone Encounter (Signed)
Spoke with patient informed to start vancomycin  taking 4 times per day for 10 days.  She states she is also on Macrobid  and has  tablets remaining ( 1 tonight and 2 tomorrow) Patient states has not had  diarrhea in 3 days   Per Christen Butter, NP - o.k. to complete the macrobid while taking the vancomycin Also patient still needs to be treated even though the diarrhea has resolved.  Also if still having urinary symptoms - o.k. to drop off urine for urinalysis and culture for processing on Monday.  Patient informed as above.   Patient was concerned over a possible component on her allergy list I told her that Christen Butter, NP had reviewed her allergy list before prescribing but that she  Could always verify with the pharmacy that no components are on her allergy list.

## 2022-11-10 NOTE — Telephone Encounter (Signed)
Nadara Mode called - [phone] Critical lab values  C. Diff detected  -

## 2022-11-10 NOTE — Telephone Encounter (Signed)
If patient is stable with no concerning signs for dehydration, weakness, abdominal pain, fever, chills, nausea, vomiting, etc. then please have her start vancomycin 125 mg by mouth 4 times daily for a 10-day course.  If she is having any of the above symptoms, please have her report to the emergency room for further evaluation and management since she may need fluid resuscitation while undergoing treatment.  ___________________________________________ Thayer Ohm, DNP, APRN, FNP-BC Primary Care and Sports Medicine [facility]

## 2022-11-11 ENCOUNTER — Telehealth: Payer: Self-pay | Admitting: Family Medicine

## 2022-11-11 ENCOUNTER — Encounter: Payer: Self-pay | Admitting: Family Medicine

## 2022-11-11 NOTE — Telephone Encounter (Signed)
Spoke with patient on the phone  -she is aware that she is to take the vanc and took the first dose yesterday - she is doing well and aware of ED precautions  - recommended increase hydration - discussed contagious factor and recommended only using one toilet in the house and not sharing toilets

## 2022-11-17 LAB — C. DIFFICILE GDH AND TOXIN A/B
GDH ANTIGEN: DETECTED
MICRO NUMBER:: 15140009
SPECIMEN QUALITY:: ADEQUATE
TOXIN A AND B: DETECTED

## 2022-11-17 LAB — GIARDIA ANTIGEN: SPECIMEN QUALITY:: ADEQUATE

## 2022-11-17 LAB — OVA AND PARASITE EXAMINATION
CONCENTRATE RESULT:: NONE SEEN
MICRO NUMBER:: 15138713
SPECIMEN QUALITY:: ADEQUATE
TRICHROME RESULT:: NONE SEEN

## 2022-11-20 ENCOUNTER — Telehealth: Payer: Self-pay | Admitting: Family Medicine

## 2022-11-20 NOTE — Telephone Encounter (Signed)
Spoke to patient. She notes symptom improvement and that it is her last day on abx.   Discussed not needing follow up testing for cure as pcr can be positive after abx usage. Told her to monitor for symptom recurrence. If symptoms reoccur we may need to consider another round of abx.   Pt verbalized understanding

## 2022-11-22 ENCOUNTER — Other Ambulatory Visit: Payer: Self-pay | Admitting: Family Medicine

## 2022-11-22 DIAGNOSIS — I10 Essential (primary) hypertension: Secondary | ICD-10-CM

## 2022-12-22 ENCOUNTER — Other Ambulatory Visit: Payer: Self-pay | Admitting: Family Medicine

## 2022-12-22 NOTE — Telephone Encounter (Signed)
Last OV: 10/10/22 Next OV: 04/16/23 Last RF: 09/19/22

## 2023-02-23 ENCOUNTER — Ambulatory Visit (HOSPITAL_BASED_OUTPATIENT_CLINIC_OR_DEPARTMENT_OTHER): Payer: Medicare Other | Admitting: Cardiology

## 2023-02-23 ENCOUNTER — Encounter (HOSPITAL_BASED_OUTPATIENT_CLINIC_OR_DEPARTMENT_OTHER): Payer: Self-pay | Admitting: Cardiology

## 2023-02-23 VITALS — BP 142/78 | HR 77 | Ht 59.0 in | Wt 142.0 lb

## 2023-02-23 DIAGNOSIS — I351 Nonrheumatic aortic (valve) insufficiency: Secondary | ICD-10-CM

## 2023-02-23 DIAGNOSIS — I10 Essential (primary) hypertension: Secondary | ICD-10-CM

## 2023-02-23 DIAGNOSIS — R0602 Shortness of breath: Secondary | ICD-10-CM | POA: Diagnosis not present

## 2023-02-23 DIAGNOSIS — Z7189 Other specified counseling: Secondary | ICD-10-CM

## 2023-02-23 DIAGNOSIS — J432 Centrilobular emphysema: Secondary | ICD-10-CM | POA: Diagnosis not present

## 2023-02-23 NOTE — Patient Instructions (Signed)

## 2023-02-23 NOTE — Progress Notes (Signed)
Cardiology Office Note:  .    Date:  02/23/2023  ID:  Allison Zuniga, DOB Mar 12, 1941, MRN [id number] PCP: Everrett Coombe, DO  [facility] Providers Cardiologist:  Jodelle Red, MD     History of Present Illness: .    Allison Zuniga is a 82 y.o. female with a hx of DVT/PE, hypertension, COPD, rheumatoid arthritis, prior tobacco use, who is seen for follow up today.  I initially met her 03/14/2019 as a new consult at the request of Bernita Raisin, MD for the evaluation and management of shortness of breath, mild AI, EF 50% on prior echo.   At her visit 01/2021, she was doing well. Her blood pressure had been low but was improved since cutting back on atenolol and taking it at night. Took irbesartan in the mornings. On methotrexate for rheumatoid arthritis. On prolia for osteoporosis. On trelegy for COPD. She reported resolution of her shortness of breath with no COPD flares.  She was admitted to the hospital 06/10/2022 with complaints of high fever and severe myalgias for several days. At one point noted to have altered mental status in the setting of acute viral syndrome. During her hospital course she was found to have left LE DVT and small PE; Eliquis was initiated. Low-flow oxygen supplementation was provided as needed and weaned as able. Discharged 06/17/2022.   Today, she states that she is doing well from a cardiovascular perspective. Her main complaints include neuropathy, chronic back pain (osteoporosis, ankylosing spondylosis), and balance issues. She denies any falls and uses a walker for assistance. For exercise she routinely participates in aquatic therapy.  She is currently on pregabalin and expresses concerns about this medication possibly causing worsening symptoms. Occasionally the skin of her arms and feet feel too cold. At this time she has completed her Eliquis therapy. In the office her blood pressure is 144/80 initially, and 142/78 on manual recheck. Usually  closer to 135/70 at home.  She denies any palpitations, chest pain, shortness of breath, peripheral edema, lightheadedness, headaches, syncope, orthopnea, or PND.  ROS:  Please see the history of present illness. ROS otherwise negative except as noted.  (+) Neuropathy (+) Chronic back pain (+) Imbalance  Studies Reviewed: .         Echocardiogram  06/15/2022  ([facility]): Left Ventricle: EF: 60-65%. Quantitative analysis of left ventricular  Global Longitudinal Strain (GLS) imaging is -20.600%. Ejection fraction  measured by 3D is 62%, which is normal.    Left Ventricle: Wall motion is normal.    Aortic Valve: The aortic valve is tricuspid. There is mild sclerosis.    Aortic Valve: Mild aortic valve regurgitation.    Aortic Valve: There is no evidence of aortic valve stenosis.    Mitral Valve: There is trace regurgitation.    Tricuspid Valve: There is mild regurgitation.    Tricuspid Valve: The right ventricular systolic pressure is normal (<36  mmHg).   Bilateral LE Venous Doppler  06/14/2022  ([facility]): FINDINGS:  There is nonocclusive thrombus within the left deep femoral vein, femoral vein, popliteal and posterior tibial veins. Lack of normal venous compressibility and augmentation. Flow is present by color and/or spectral Doppler analysis.   The remaining interrogated veins within the lower extremities are without thrombus. Normal venous compressibility is demonstrated and there is augmentation of flow with appropriate maneuvers.     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   CTA Pulmonary 06/14/2022  ([facility]): IMPRESSION:  1. Small  right lower lobe pulmonary embolus.  2. Lung nodules, largest measuring 8 mm in the right lower lobe  3. Lingular subsegmental atelectasis and bronchiectasis  4. Moderate bilateral pleural effusions with dependent bilateral lower lobe atelectasis  5. Coronary and aortic ASVD  6. Osteoporotic compression  fractures as above.   Physical Exam:    VS:  BP (!) 142/78 (BP Location: Right Arm, Patient Position: Sitting, Cuff Size: Normal)   Pulse 77   Ht 4\' 11"  (1.499 m)   Wt 142 lb (64.4 kg)   SpO2 94%   BMI 28.68 kg/m    Wt Readings from Last 3 Encounters:  02/23/23 142 lb (64.4 kg)  11/07/22 138 lb 8 oz (62.8 kg)  10/10/22 140 lb (63.5 kg)    GEN: Well nourished, well developed in no acute distress HEENT: Normal, moist mucous membranes NECK: No JVD CARDIAC: regular rhythm, normal S1 and S2, no rubs or gallops. No murmur. VASCULAR: Radial and DP pulses 2+ bilaterally. No carotid bruits RESPIRATORY:  Clear to auscultation without rales, wheezing or rhonchi  ABDOMEN: Soft, non-tender, non-distended MUSCULOSKELETAL:  Ambulates independently SKIN: Warm and dry, no edema NEUROLOGIC:  Alert and oriented x 3. No focal neuro deficits noted. PSYCHIATRIC:  Normal affect   ASSESSMENT AND PLAN: .    Shortness of breath: suspect predominantly pulmonary in origin. -management of COPD per Dr. Tonia Brooms -euvolemic on exam -no suggestion of diastolic dysfunction on echo -mild systolic dysfunction, though no comment on focal wall motion abnormalities.  -discussed red flag warning signs that need immediate medical attention -aortic regurgitation only mild   Hypertension:  -reports good control at home, typically elevated in the office. She will contact me if it becomes persistently elevated at home -continue irbesartan -no cardiac necessity for beta blocker, if needed could wean atenolol (esp given COPD) and use chlorthalidone or amlodipine instead -she feels well on current regimen, will monitor her symptoms   Cardiac risk counseling and prevention recommendations: -recommend heart healthy/Mediterranean diet, with whole grains, fruits, vegetable, fish, lean meats, nuts, and olive oil. Limit salt. -recommend moderate walking, 3-5 times/week for 30-50 minutes each session. Aim for at least 150  minutes.week. Goal should be pace of 3 miles/hours, or walking 1.5 miles in 30 minutes -recommend avoidance of tobacco products. Avoid excess alcohol. -no known ASCVD, and she is beyond age guidelines for lipid management.   Dispo: Follow-up in 1 year, or sooner as needed.  I,Mathew Stumpf,acting as a Neurosurgeon for [employer], MD.,have documented all relevant documentation on the behalf of Jodelle Red, MD,as directed by  Jodelle Red, MD while in the presence of Jodelle Red, MD.  I, Jodelle Red, MD, have reviewed all documentation for this visit. The documentation on 04/15/23 for the exam, diagnosis, procedures, and orders are all accurate and complete.   Signed, Jodelle Red, MD

## 2023-03-07 ENCOUNTER — Ambulatory Visit (INDEPENDENT_AMBULATORY_CARE_PROVIDER_SITE_OTHER): Payer: Medicare Other | Admitting: Family Medicine

## 2023-03-07 VITALS — BP 143/76 | HR 72 | Ht 59.0 in | Wt 142.0 lb

## 2023-03-07 DIAGNOSIS — Z23 Encounter for immunization: Secondary | ICD-10-CM

## 2023-03-07 NOTE — Progress Notes (Signed)
Pt presents to day for immunization. No allergy to eggs or latex.   Location: LD  Pt tolerated well.   

## 2023-03-07 NOTE — Progress Notes (Signed)
Medical screening examination/treatment was performed by qualified clinical staff member and as supervising physician I was immediately available for consultation/collaboration. I have reviewed documentation and agree with assessment and plan.  Jennilyn Esteve S. Kimmberly Wisser, DO  

## 2023-03-08 ENCOUNTER — Ambulatory Visit: Payer: Medicare Other

## 2023-03-17 ENCOUNTER — Other Ambulatory Visit: Payer: Self-pay | Admitting: Family Medicine

## 2023-03-21 ENCOUNTER — Ambulatory Visit (INDEPENDENT_AMBULATORY_CARE_PROVIDER_SITE_OTHER): Payer: Medicare Other

## 2023-03-21 ENCOUNTER — Other Ambulatory Visit: Payer: Self-pay

## 2023-03-21 VITALS — Temp 98.6°F

## 2023-03-21 DIAGNOSIS — Z23 Encounter for immunization: Secondary | ICD-10-CM | POA: Diagnosis not present

## 2023-03-21 MED ORDER — ALBUTEROL SULFATE HFA 108 (90 BASE) MCG/ACT IN AERS: 1.0000 | INHALATION_SPRAY | Freq: Four times a day (QID) | RESPIRATORY_TRACT | 3 refills | Status: AC | PRN

## 2023-03-21 NOTE — Progress Notes (Signed)
Patient is here for Covid vaccine.   Patient tolerated injection well without complications on the left deltoid.

## 2023-03-21 NOTE — Progress Notes (Signed)
Medical screening examination/treatment was performed by qualified clinical staff member and as supervising physician I was immediately available for consultation/collaboration. I have reviewed documentation and agree with assessment and plan.  Ayme Short, DO  

## 2023-04-13 ENCOUNTER — Ambulatory Visit: Payer: Medicare Other | Admitting: Family Medicine

## 2023-04-16 ENCOUNTER — Ambulatory Visit (INDEPENDENT_AMBULATORY_CARE_PROVIDER_SITE_OTHER): Payer: Medicare Other | Admitting: Family Medicine

## 2023-04-16 ENCOUNTER — Encounter: Payer: Self-pay | Admitting: Family Medicine

## 2023-04-16 VITALS — BP 156/85 | HR 79 | Ht 59.0 in | Wt 140.0 lb

## 2023-04-16 DIAGNOSIS — E559 Vitamin D deficiency, unspecified: Secondary | ICD-10-CM

## 2023-04-16 DIAGNOSIS — I10 Essential (primary) hypertension: Secondary | ICD-10-CM

## 2023-04-16 DIAGNOSIS — E785 Hyperlipidemia, unspecified: Secondary | ICD-10-CM

## 2023-04-16 DIAGNOSIS — R531 Weakness: Secondary | ICD-10-CM

## 2023-04-16 DIAGNOSIS — E039 Hypothyroidism, unspecified: Secondary | ICD-10-CM

## 2023-04-16 DIAGNOSIS — E538 Deficiency of other specified B group vitamins: Secondary | ICD-10-CM | POA: Diagnosis not present

## 2023-04-16 DIAGNOSIS — S32000A Wedge compression fracture of unspecified lumbar vertebra, initial encounter for closed fracture: Secondary | ICD-10-CM

## 2023-04-16 DIAGNOSIS — J432 Centrilobular emphysema: Secondary | ICD-10-CM

## 2023-04-16 DIAGNOSIS — G629 Polyneuropathy, unspecified: Secondary | ICD-10-CM

## 2023-04-16 NOTE — Patient Instructions (Signed)
Try alpha lipoic acid supplement for neuropathy.  We'll be in touch with lab results.

## 2023-04-16 NOTE — Assessment & Plan Note (Signed)
Blood pressure is a little elevated today.  Readings at home have been better.  She will continue to monitor and continue current medications.

## 2023-04-16 NOTE — Assessment & Plan Note (Signed)
Update TSH

## 2023-04-16 NOTE — Assessment & Plan Note (Signed)
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  Stable at this time with current dose of lyrica.  She will continue prolia as well for associated osteoporosis.

## 2023-04-16 NOTE — Assessment & Plan Note (Signed)
Updated lipid panel today.

## 2023-04-16 NOTE — Progress Notes (Signed)
Allison Zuniga - 82 y.o. female MRN [id number]  Date of birth: January 26, 1941  Subjective Chief Complaint  Patient presents with   Hypertension    HPI Allison Zuniga is a 82 y.o. female here today for follow up visit.   She reports that she is doing okay overall.   She remains on irbesartan and atenolol for management of HTN.  BP is elevated today.  Readings at home have been better..  She has not had chest pain, shortness of breath, palpitations, headache or vision changes.   History of lumbar compression fracture and remains on lyrica for management of pain related to this.  She reports that this is working well for her.  She is taking this twice per day.  He is also using neuropathy treatment from chiropractic office.  She is unsure if this is helping.  Taking synthroid daily and feels ok at this time.   She continues to see pulmonology for management of COPD/Emphysema.  Stable with trelegy and albuterol as needed.   ROS:  A comprehensive ROS was completed and negative except as noted per HPI  Allergies  Allergen Reactions   Sulfa Antibiotics Other (See Comments)    Chest tightness   Aspirin Hives   Penicillins Hives   Clindamycin/Lincomycin Other (See Comments)    C. diff    Past Medical History:  Diagnosis Date   COPD (chronic obstructive pulmonary disease) (HCC)    Former smoker    Hypertension    Hypothyroidism    Pneumonia    > 10 years ago per pt     Past Surgical History:  Procedure Laterality Date   EYE SURGERY     bilateral cataract removal 2021 per pt    KYPHOPLASTY N/A 09/24/2019   Procedure: THORACIC TWELVE, LUMBAR ONE KYPHOPLASTY;  Surgeon: Venita Lick, MD;  Location: [facility] OR;  Service: Orthopedics;  Laterality: N/A;  60 ins    Social History   Socioeconomic History   Marital status: Widowed    Spouse name: Heleen Weinberg   Number of children: Not on file   Years of education: Not on file   Highest education level: Not on file  Occupational  History   Not on file  Tobacco Use   Smoking status: Former    Current packs/day: 0.00    Average packs/day: 1 pack/day for 30.0 years (30.0 ttl pk-yrs)    Types: Cigarettes    Start date: 31    Quit date: 2015    Years since quitting: 9.9   Smokeless tobacco: Former  Building services engineer status: Never Used  Substance and Sexual Activity   Alcohol use: Yes    Alcohol/week: 1.0 - 2.0 standard drink of alcohol    Types: 1 - 2 Standard drinks or equivalent per week   Drug use: Never   Sexual activity: Not Currently  Other Topics Concern   Not on file  Social History Narrative   Not on file   Social Determinants of Health   Financial Resource Strain: Low Risk  (06/10/2022)   Received from [facility], [facility]   Overall Financial Resource Strain (CARDIA)    Difficulty of Paying Living Expenses: Not hard at all  Food Insecurity: No Food Insecurity (10/27/2020)   Received from [facility], [facility]   Hunger Vital Sign    Worried About Running Out of Food in the Last Year: Never true    Ran Out of Food in the Last Year: Never true  Transportation  Needs: No Transportation Needs (06/15/2022)   Received from [facility], [facility]   [facility] - Transportation    Lack of Transportation (Medical): No    Lack of Transportation (Non-Medical): No  Physical Activity: Not on file  Stress: No Stress Concern Present (06/10/2022)   Received from [facility], [facility] - Occupational Stress Questionnaire    Feeling of Stress : Only a little  Social Connections: Unknown (09/27/2021)   Received from [facility], [facility]   Social Network    Social Network: Not on file    Family History  Problem Relation Age of Onset   Hypertension Mother    Asthma Mother    Hypertension Father     Health Maintenance  Topic Date Due   Medicare Annual Wellness (AWV)  03/29/2017   Zoster Vaccines- Shingrix (1 of 2) 06/21/2023  (Originally 08/22/1960)   DTaP/Tdap/Td (2 - Tdap) 03/20/2024 (Originally 12/27/2013)   Pneumonia Vaccine 60+ Years old (2 of 2 - PPSV23 or PCV20) 03/20/2024 (Originally 06/21/2015)   COVID-19 Vaccine (6 - 2023-24 season) 05/16/2023   INFLUENZA VACCINE  Completed   DEXA SCAN  Completed   HPV VACCINES  Aged Out   Lung Cancer Screening  Discontinued     ----------------------------------------------------------------------------------------------------------------------------------------------------------------------------------------------------------------- Physical Exam BP (!) 156/85   Pulse 79   Ht 4\' 11"  (1.499 m)   Wt 140 lb (63.5 kg)   SpO2 97%   BMI 28.28 kg/m   Physical Exam Constitutional:      Appearance: Normal appearance.  HENT:     Head: Normocephalic and atraumatic.  Cardiovascular:     Rate and Rhythm: Normal rate and regular rhythm.  Pulmonary:     Effort: Pulmonary effort is normal.     Breath sounds: Normal breath sounds.  Musculoskeletal:     Cervical back: Neck supple.  Neurological:     General: No focal deficit present.     Mental Status: She is alert.  Psychiatric:        Mood and Affect: Mood normal.        Behavior: Behavior normal.     ------------------------------------------------------------------------------------------------------------------------------------------------------------------------------------------------------------------- Assessment and Plan  Chronic obstructive pulmonary disease, unspecified (HCC) Management per pulmonology.  Stable at this time.  I did encourage her to have RSV vaccination.  Hypothyroidism Update TSH.  Hyperlipidemia with target LDL less than 100 Updated lipid panel today.  Essential hypertension Blood pressure is a little elevated today.  Readings at home have been better.  She will continue to monitor and continue current medications.  Lumbar compression fracture [facility]) She will continue lyrica for  management of pain and neuropathy related to this.  Stable at this time with current dose of lyrica.  She will continue prolia as well for associated osteoporosis.     No orders of the defined types were placed in this encounter.   Return in about 6 months (around 10/15/2023) for Hypertension.    This visit occurred during the SARS-CoV-2 public health emergency.  Safety protocols were in place, including screening questions prior to the visit, additional usage of staff PPE, and extensive cleaning of exam room while observing appropriate contact time as indicated for disinfecting solutions.

## 2023-04-16 NOTE — Assessment & Plan Note (Signed)
Management per pulmonology.  Stable at this time.  I did encourage her to have RSV vaccination.

## 2023-04-17 LAB — LIPID PANEL WITH LDL/HDL RATIO
Cholesterol, Total: 211 mg/dL — ABNORMAL HIGH (ref 100–199)
HDL: 65 mg/dL (ref >39)
LDL Chol Calc (NIH): 116 mg/dL — ABNORMAL HIGH (ref 0–99)
LDL/HDL Ratio: 1.8 {ratio} (ref 0.0–3.2)
Triglycerides: 174 mg/dL — ABNORMAL HIGH (ref 0–149)
VLDL Cholesterol Cal: 30 mg/dL (ref 5–40)

## 2023-04-17 LAB — CBC WITH DIFFERENTIAL/PLATELET
Basophils Absolute: 0.1 10*3/uL (ref 0.0–0.2)
Basos: 1 %
EOS (ABSOLUTE): 0.2 10*3/uL (ref 0.0–0.4)
Eos: 2 %
Hematocrit: 42.3 % (ref 34.0–46.6)
Hemoglobin: 13.8 g/dL (ref 11.1–15.9)
Immature Grans (Abs): 0 10*3/uL (ref 0.0–0.1)
Immature Granulocytes: 0 %
Lymphocytes Absolute: 1.8 10*3/uL (ref 0.7–3.1)
Lymphs: 23 %
MCH: 30.5 pg (ref 26.6–33.0)
MCHC: 32.6 g/dL (ref 31.5–35.7)
MCV: 94 fL (ref 79–97)
Monocytes Absolute: 0.6 10*3/uL (ref 0.1–0.9)
Monocytes: 8 %
Neutrophils Absolute: 5.2 10*3/uL (ref 1.4–7.0)
Neutrophils: 66 %
Platelets: 340 10*3/uL (ref 150–450)
RBC: 4.52 x10E6/uL (ref 3.77–5.28)
RDW: 12.6 % (ref 11.7–15.4)
WBC: 7.9 10*3/uL (ref 3.4–10.8)

## 2023-04-17 LAB — CMP14+EGFR
ALT: 16 [IU]/L (ref 0–32)
AST: 21 [IU]/L (ref 0–40)
Albumin: 4.2 g/dL (ref 3.7–4.7)
Alkaline Phosphatase: 91 [IU]/L (ref 44–121)
BUN/Creatinine Ratio: 24 (ref 12–28)
BUN: 15 mg/dL (ref 8–27)
Bilirubin Total: 0.4 mg/dL (ref 0.0–1.2)
CO2: 27 mmol/L (ref 20–29)
Calcium: 9.3 mg/dL (ref 8.7–10.3)
Chloride: 100 mmol/L (ref 96–106)
Creatinine, Ser: 0.63 mg/dL (ref 0.57–1.00)
Globulin, Total: 2.8 g/dL (ref 1.5–4.5)
Glucose: 80 mg/dL (ref 70–99)
Potassium: 4.2 mmol/L (ref 3.5–5.2)
Sodium: 141 mmol/L (ref 134–144)
Total Protein: 7 g/dL (ref 6.0–8.5)
eGFR: 89 mL/min/{1.73_m2} (ref >59)

## 2023-04-17 LAB — IRON,TIBC AND FERRITIN PANEL
Ferritin: 132 ng/mL (ref 15–150)
Iron Saturation: 21 % (ref 15–55)
Iron: 75 ug/dL (ref 27–139)
Total Iron Binding Capacity: 363 ug/dL (ref 250–450)
UIBC: 288 ug/dL (ref 118–369)

## 2023-04-17 LAB — VITAMIN B12: Vitamin B-12: 823 pg/mL (ref 232–1245)

## 2023-04-17 LAB — VITAMIN D 25 HYDROXY (VIT D DEFICIENCY, FRACTURES): Vit D, 25-Hydroxy: 53.7 ng/mL (ref 30.0–100.0)

## 2023-04-17 LAB — TSH: TSH: 2.09 u[IU]/mL (ref 0.450–4.500)

## 2023-05-10 ENCOUNTER — Other Ambulatory Visit: Payer: Self-pay | Admitting: Pulmonary Disease

## 2023-05-10 DIAGNOSIS — J432 Centrilobular emphysema: Secondary | ICD-10-CM

## 2023-05-29 ENCOUNTER — Telehealth: Payer: Self-pay | Admitting: Pulmonary Disease

## 2023-05-29 MED ORDER — TRELEGY ELLIPTA 100-62.5-25 MCG/ACT IN AEPB: 1.0000 | INHALATION_SPRAY | Freq: Every day | RESPIRATORY_TRACT | 11 refills | Status: DC

## 2023-05-29 NOTE — Telephone Encounter (Signed)
 PCCM:  Refills for trelegy sent pharmacy   Josephine Igo, DO [facility] 05/29/2023 12:33 PM

## 2023-06-09 ENCOUNTER — Other Ambulatory Visit: Payer: Self-pay | Admitting: Family Medicine

## 2023-06-09 DIAGNOSIS — I10 Essential (primary) hypertension: Secondary | ICD-10-CM

## 2023-06-13 ENCOUNTER — Other Ambulatory Visit: Payer: Self-pay | Admitting: Family Medicine

## 2023-06-13 NOTE — Telephone Encounter (Signed)
Copied from CRM [id number]. Topic: Clinical - Medication Refill >> Jun 13, 2023  9:17 AM Allison Zuniga wrote: Most Recent Primary Care Visit:  Provider: Everrett Coombe  Department: [facility]  Visit Type: OFFICE VISIT  Date: 04/16/2023  Medication: pregabalin (LYRICA) 50 MG capsule [[id number]]  Has the patient contacted their pharmacy? Yes (Agent: If no, request that the patient contact the pharmacy for the refill. If patient does not wish to contact the pharmacy document the reason why and proceed with request.) (Agent: If yes, when and what did the pharmacy advise?)  Is this the correct pharmacy for this prescription? Yes If no, delete pharmacy and type the correct one.  This is the patient's preferred pharmacy:   [facility] [address], [place] - [address] [place] [address] Phone: [phone] Fax: [phone]   Has the prescription been filled recently? No  Is the patient out of the medication? Yes  Has the patient been seen for an appointment in the last year OR does the patient have an upcoming appointment? Yes  Can we respond through MyChart? No  Agent: Please be advised that Rx refills may take up to 3 business days. We ask that you follow-up with your pharmacy.

## 2023-06-19 ENCOUNTER — Other Ambulatory Visit: Payer: Self-pay

## 2023-07-19 ENCOUNTER — Telehealth: Payer: Self-pay

## 2023-07-19 DIAGNOSIS — S22000S Wedge compression fracture of unspecified thoracic vertebra, sequela: Secondary | ICD-10-CM

## 2023-07-19 DIAGNOSIS — S32000A Wedge compression fracture of unspecified lumbar vertebra, initial encounter for closed fracture: Secondary | ICD-10-CM

## 2023-07-19 DIAGNOSIS — M810 Age-related osteoporosis without current pathological fracture: Secondary | ICD-10-CM

## 2023-07-19 NOTE — Telephone Encounter (Signed)
 Copied from CRM [id number]. Topic: Appointments - Scheduling Inquiry for Clinic >> Jul 17, 2023 10:50 AM Shon Hale wrote: Reason for CRM: Patient requesting appointment for prolia injection. Patient received injection from rheumatologist in November 2024.   Patient is scheduled this Thursday at Rheumatologist office for injection.   Please assist patient further.

## 2023-07-19 NOTE — Telephone Encounter (Signed)
 Spoke with Allison Zuniga with call center.  She was checking if a new PA would have to be done through our office to receive the Prolia injection here. She was told that a PA and lab work would be required as well as approval from her PCP to administer the PROLIA injection here ( due to necessary documentation needed)  She states we do have patient's new insurance.  She states that the last injection was 6 months ago with rheumatologist who no longer accepts her insurance. She did not give exact date of last Prolia injection.

## 2023-07-20 NOTE — Telephone Encounter (Signed)
 Rheumatology office gave injection today.  Would like to have next prolia injection through our office  We will need to work on PA for this in the mean time and is aware that will need blood work before administration Forwarding this message to Jola Babinski for prior authorization to be completed.

## 2023-08-07 ENCOUNTER — Telehealth: Payer: Self-pay

## 2023-08-07 NOTE — Telephone Encounter (Signed)
 PA required for Prolia. Thanks in advance.

## 2023-08-07 NOTE — Telephone Encounter (Signed)
 Request routed to the pharmacy PA Team.

## 2023-08-08 ENCOUNTER — Telehealth: Payer: Self-pay

## 2023-08-08 ENCOUNTER — Other Ambulatory Visit (HOSPITAL_COMMUNITY): Payer: Self-pay

## 2023-08-08 NOTE — Telephone Encounter (Signed)
 Pharmacy Patient Advocate Encounter   Received notification from Pt Calls Messages that prior authorization for Prolia is required/requested.   Insurance verification completed.   The patient is insured through [employer] .   Per test claim: The current 180 day co-pay is, $773.05.  No PA needed at this time. This test claim was processed through [facility]- copay amounts may vary at other pharmacies due to pharmacy/plan contracts, or as the patient moves through the different stages of their insurance plan.    It is not requiring a prior authorization.

## 2023-08-08 NOTE — Telephone Encounter (Signed)
 FYI - Patient has her last injection completed on 07/19/23. She will need to be scheduled in September for her labs and next injection.

## 2023-08-25 ENCOUNTER — Other Ambulatory Visit: Payer: Self-pay | Admitting: Family Medicine

## 2023-09-06 ENCOUNTER — Other Ambulatory Visit: Payer: Self-pay | Admitting: Family Medicine

## 2023-09-06 DIAGNOSIS — I10 Essential (primary) hypertension: Secondary | ICD-10-CM

## 2023-09-29 ENCOUNTER — Other Ambulatory Visit: Payer: Self-pay | Admitting: Family Medicine

## 2023-10-15 ENCOUNTER — Ambulatory Visit: Payer: Medicare Other | Admitting: Family Medicine

## 2023-10-30 ENCOUNTER — Ambulatory Visit: Payer: Medicare Other | Admitting: Family Medicine

## 2023-10-30 VITALS — BP 148/80 | HR 72 | Ht 59.0 in | Wt 138.0 lb

## 2023-10-30 DIAGNOSIS — M459 Ankylosing spondylitis of unspecified sites in spine: Secondary | ICD-10-CM

## 2023-10-30 DIAGNOSIS — E538 Deficiency of other specified B group vitamins: Secondary | ICD-10-CM | POA: Diagnosis not present

## 2023-10-30 DIAGNOSIS — I10 Essential (primary) hypertension: Secondary | ICD-10-CM

## 2023-10-30 DIAGNOSIS — G6289 Other specified polyneuropathies: Secondary | ICD-10-CM

## 2023-10-30 DIAGNOSIS — E039 Hypothyroidism, unspecified: Secondary | ICD-10-CM | POA: Diagnosis not present

## 2023-10-30 DIAGNOSIS — E559 Vitamin D deficiency, unspecified: Secondary | ICD-10-CM | POA: Diagnosis not present

## 2023-10-30 NOTE — Patient Instructions (Signed)
 Alpha lipoic acid take 2 twice per day.

## 2023-10-31 LAB — CBC WITH DIFFERENTIAL/PLATELET
Basophils Absolute: 0.1 10*3/uL (ref 0.0–0.2)
Basos: 1 %
EOS (ABSOLUTE): 0.2 10*3/uL (ref 0.0–0.4)
Eos: 2 %
Hematocrit: 42.7 % (ref 34.0–46.6)
Hemoglobin: 13.7 g/dL (ref 11.1–15.9)
Immature Grans (Abs): 0 10*3/uL (ref 0.0–0.1)
Immature Granulocytes: 0 %
Lymphocytes Absolute: 1.8 10*3/uL (ref 0.7–3.1)
Lymphs: 26 %
MCH: 30.9 pg (ref 26.6–33.0)
MCHC: 32.1 g/dL (ref 31.5–35.7)
MCV: 96 fL (ref 79–97)
Monocytes Absolute: 0.7 10*3/uL (ref 0.1–0.9)
Monocytes: 9 %
Neutrophils Absolute: 4.5 10*3/uL (ref 1.4–7.0)
Neutrophils: 62 %
Platelets: 352 10*3/uL (ref 150–450)
RBC: 4.44 x10E6/uL (ref 3.77–5.28)
RDW: 12.7 % (ref 11.7–15.4)
WBC: 7.2 10*3/uL (ref 3.4–10.8)

## 2023-10-31 LAB — CMP14+EGFR
ALT: 14 IU/L (ref 0–32)
AST: 18 IU/L (ref 0–40)
Albumin: 4.3 g/dL (ref 3.7–4.7)
Alkaline Phosphatase: 96 IU/L (ref 44–121)
BUN/Creatinine Ratio: 20 (ref 12–28)
BUN: 12 mg/dL (ref 8–27)
Bilirubin Total: 0.4 mg/dL (ref 0.0–1.2)
CO2: 24 mmol/L (ref 20–29)
Calcium: 10.5 mg/dL — ABNORMAL HIGH (ref 8.7–10.3)
Chloride: 98 mmol/L (ref 96–106)
Creatinine, Ser: 0.6 mg/dL (ref 0.57–1.00)
Globulin, Total: 2.7 g/dL (ref 1.5–4.5)
Glucose: 80 mg/dL (ref 70–99)
Potassium: 4.5 mmol/L (ref 3.5–5.2)
Sodium: 138 mmol/L (ref 134–144)
Total Protein: 7 g/dL (ref 6.0–8.5)
eGFR: 90 mL/min/{1.73_m2} (ref >59)

## 2023-10-31 LAB — VITAMIN B12: Vitamin B-12: 838 pg/mL (ref 232–1245)

## 2023-10-31 LAB — FOLATE: Folate: >20 ng/mL (ref >3.0)

## 2023-10-31 LAB — VITAMIN D 25 HYDROXY (VIT D DEFICIENCY, FRACTURES): Vit D, 25-Hydroxy: 55.9 ng/mL (ref 30.0–100.0)

## 2023-10-31 LAB — TSH: TSH: 0.968 u[IU]/mL (ref 0.450–4.500)

## 2023-11-01 ENCOUNTER — Telehealth: Payer: Self-pay | Admitting: Family Medicine

## 2023-11-01 NOTE — Telephone Encounter (Signed)
 Copied from CRM [id number]. Topic: General - Other >> Oct 31, 2023  4:14 PM Tisa Forester wrote: Reason for CRM: patient need assistance with patient mychart message was sent from Zoila Hines cody , Patient would like for someone to go over her visit summary from 10/30/23  and need lab results  Pt callback [phone]

## 2023-11-04 ENCOUNTER — Encounter: Payer: Self-pay | Admitting: Family Medicine

## 2023-11-04 DIAGNOSIS — G629 Polyneuropathy, unspecified: Secondary | ICD-10-CM | POA: Insufficient documentation

## 2023-11-04 NOTE — Progress Notes (Signed)
 Allison Zuniga - 83 y.o. female MRN [id number]  Date of birth: 05-20-1940  Subjective Chief Complaint  Patient presents with   Peripheral Neuropathy    HPI Allison Zuniga is an 83 year old female here today for follow-up visit.  Doing well with atenolol  and irbesartan  for management of hypertension.  Blood pressure is a little elevated in clinic today.  She does monitor at home with better readings.  Blood pressure cuff has been calibrated properly.  She has not had any symptoms related to hypertension including chest pain, shortness of breath, palpitations, headaches or vision changes.  She has continued to deal with some neuropathy.  She is on methotrexate  but does supplement with folic acid .  Her levothyroxine  strength has been fairly stable.  She does remain on Lyrica  for management of symptoms.  She does feel that this does help.  ROS:  A comprehensive ROS was completed and negative except as noted per HPI  Allergies  Allergen Reactions   Sulfa Antibiotics Other (See Comments)    Chest tightness   Aspirin Hives   Penicillins Hives   Clindamycin/Lincomycin Other (See Comments)    C. diff    Past Medical History:  Diagnosis Date   COPD (chronic obstructive pulmonary disease) (HCC)    Former smoker    Hypertension    Hypothyroidism    Pneumonia    > 10 years ago per pt     Past Surgical History:  Procedure Laterality Date   EYE SURGERY     bilateral cataract removal 2021 per pt    KYPHOPLASTY N/A 09/24/2019   Procedure: THORACIC TWELVE, LUMBAR ONE KYPHOPLASTY;  Surgeon: Burnetta Aures, MD;  Location: [facility] OR;  Service: Orthopedics;  Laterality: N/A;  60 ins    Social History   Socioeconomic History   Marital status: Widowed    Spouse name: Murlean Seelye   Number of children: Not on file   Years of education: Not on file   Highest education level: Not on file  Occupational History   Not on file  Tobacco Use   Smoking status: Former    Current packs/day: 0.00     Average packs/day: 1 pack/day for 30.0 years (30.0 ttl pk-yrs)    Types: Cigarettes    Start date: 70    Quit date: 2015    Years since quitting: 10.4   Smokeless tobacco: Former  Building services engineer status: Never Used  Substance and Sexual Activity   Alcohol use: Yes    Alcohol/week: 1.0 - 2.0 standard drink of alcohol    Types: 1 - 2 Standard drinks or equivalent per week   Drug use: Never   Sexual activity: Not Currently  Other Topics Concern   Not on file  Social History Narrative   Not on file   Social Drivers of Health   Financial Resource Strain: Low Risk  (06/10/2022)   Received from [facility]   Overall Financial Resource Strain (CARDIA)    Difficulty of Paying Living Expenses: Not hard at all  Food Insecurity: No Food Insecurity (10/27/2020)   Received from [facility]   Hunger Vital Sign    Within the past 12 months, you worried that your food would run out before you got the money to buy more.: Never true    Within the past 12 months, the food you bought just didn't last and you didn't have money to get more.: Never true  Transportation Needs: No Transportation Needs (06/15/2022)   Received from [facility]  PRAPARE - Administrator, Civil Service (Medical): No    Lack of Transportation (Non-Medical): No  Physical Activity: Not on file  Stress: No Stress Concern Present (06/10/2022)   Received from [facility] of Occupational Health - Occupational Stress Questionnaire    Feeling of Stress : Only a little  Social Connections: Unknown (09/27/2021)   Received from [facility]   Social Network    Social Network: Not on file    Family History  Problem Relation Age of Onset   Hypertension Mother    Asthma Mother    Hypertension Father     Health Maintenance  Topic Date Due   Zoster Vaccines- Shingrix (1 of 2) Never done   [employer] Wellness (AWV)  03/29/2017   COVID-19 Vaccine (6 - Moderna risk 2024-25 season)  09/18/2023   DTaP/Tdap/Td (2 - Tdap) 03/20/2024 (Originally 12/27/2013)   Pneumococcal Vaccine: 50+ Years (2 of 2 - PPSV23, PCV20, or PCV21) 03/20/2024 (Originally 06/21/2015)   INFLUENZA VACCINE  12/14/2023   DEXA SCAN  Completed   HPV VACCINES  Aged Out   Meningococcal B Vaccine  Aged Out   Lung Cancer Screening  Discontinued     ----------------------------------------------------------------------------------------------------------------------------------------------------------------------------------------------------------------- Physical Exam BP (!) 148/80 (BP Location: Left Arm, Patient Position: Sitting, Cuff Size: Normal)   Pulse 72   Ht 4' 11 (1.499 m)   Wt 138 lb (62.6 kg)   SpO2 97%   BMI 27.87 kg/m   Physical Exam Constitutional:      Appearance: Normal appearance.   Eyes:     General: No scleral icterus.   Cardiovascular:     Rate and Rhythm: Normal rate and regular rhythm.  Pulmonary:     Effort: Pulmonary effort is normal.     Breath sounds: Normal breath sounds.   Musculoskeletal:     Cervical back: Neck supple.   Neurological:     General: No focal deficit present.     Mental Status: She is alert.   Psychiatric:        Mood and Affect: Mood normal.        Behavior: Behavior normal.     ------------------------------------------------------------------------------------------------------------------------------------------------------------------------------------------------------------------- Assessment and Plan  Essential hypertension Blood pressure is a little elevated today.  Readings at home have been better.  She will continue to monitor and continue current medications.  Hypothyroidism Update TSH.  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  Management per rheumatology.  Peripheral neuropathy She will continue Lyrica  at current strength.  Additional labs ordered for assessment of causes of  neuropathy.   No orders of the defined types were placed in this encounter.   Return in about 6 months (around 04/30/2024) for Hypertension.

## 2023-11-04 NOTE — Assessment & Plan Note (Signed)
 Update TSH

## 2023-11-04 NOTE — Assessment & Plan Note (Signed)
 Blood pressure is a little elevated today.  Readings at home have been better.  She will continue to monitor and continue current medications.

## 2023-11-04 NOTE — Assessment & Plan Note (Signed)
Continues on methotrexate for management of this.  Management per rheumatology.

## 2023-11-04 NOTE — Assessment & Plan Note (Signed)
 She will continue Lyrica  at current strength.  Additional labs ordered for assessment of causes of neuropathy.

## 2023-11-09 ENCOUNTER — Ambulatory Visit: Payer: Self-pay | Admitting: Family Medicine

## 2023-12-04 ENCOUNTER — Other Ambulatory Visit: Payer: Self-pay | Admitting: Family Medicine

## 2023-12-04 DIAGNOSIS — I10 Essential (primary) hypertension: Secondary | ICD-10-CM

## 2023-12-25 ENCOUNTER — Encounter: Payer: Self-pay | Admitting: Neurology

## 2023-12-27 ENCOUNTER — Other Ambulatory Visit: Payer: Self-pay | Admitting: Family Medicine

## 2024-01-05 ENCOUNTER — Other Ambulatory Visit: Payer: Self-pay | Admitting: Family Medicine

## 2024-01-21 ENCOUNTER — Other Ambulatory Visit: Payer: Self-pay

## 2024-01-21 DIAGNOSIS — G609 Hereditary and idiopathic neuropathy, unspecified: Secondary | ICD-10-CM

## 2024-01-21 DIAGNOSIS — R634 Abnormal weight loss: Secondary | ICD-10-CM

## 2024-01-21 DIAGNOSIS — E559 Vitamin D deficiency, unspecified: Secondary | ICD-10-CM

## 2024-01-21 DIAGNOSIS — R202 Paresthesia of skin: Secondary | ICD-10-CM

## 2024-01-24 LAB — PROTEIN ELECTROPHORESIS, SERUM
A/G Ratio: 1.1 (ref 0.7–1.7)
Albumin ELP: 3.7 g/dL (ref 2.9–4.4)
Alpha 1: 0.2 g/dL (ref 0.0–0.4)
Alpha 2: 0.8 g/dL (ref 0.4–1.0)
Beta: 1.3 g/dL (ref 0.7–1.3)
Gamma Globulin: 1.1 g/dL (ref 0.4–1.8)
Globulin, Total: 3.5 g/dL (ref 2.2–3.9)
Total Protein: 7.2 g/dL (ref 6.0–8.5)

## 2024-01-24 LAB — VITAMIN B6: Vitamin B6: 24 ug/L (ref 3.4–65.2)

## 2024-01-24 LAB — TSH: TSH: 1.03 u[IU]/mL (ref 0.450–4.500)

## 2024-01-24 LAB — B12 AND FOLATE PANEL
Folate: >20 ng/mL (ref >3.0)
Vitamin B-12: 944 pg/mL (ref 232–1245)

## 2024-01-24 LAB — VITAMIN D 25 HYDROXY (VIT D DEFICIENCY, FRACTURES): Vit D, 25-Hydroxy: 66.6 ng/mL (ref 30.0–100.0)

## 2024-01-24 LAB — ANA: Anti Nuclear Antibody (ANA): POSITIVE — AB

## 2024-02-22 ENCOUNTER — Other Ambulatory Visit: Payer: Self-pay | Admitting: Family Medicine

## 2024-02-24 ENCOUNTER — Other Ambulatory Visit: Payer: Self-pay | Admitting: Family Medicine

## 2024-02-26 ENCOUNTER — Ambulatory Visit: Admitting: Neurology

## 2024-03-26 ENCOUNTER — Telehealth: Payer: Self-pay

## 2024-03-26 NOTE — Telephone Encounter (Signed)
 Copied from CRM [id number]. Topic: General - Other >> Mar 26, 2024  2:08 PM Wess RAMAN wrote: Reason for CRM: Patient would like to know if Dr. Alvia recommends her to get the RSV and Covid shot.  Callback #: [phone]

## 2024-03-27 ENCOUNTER — Ambulatory Visit (INDEPENDENT_AMBULATORY_CARE_PROVIDER_SITE_OTHER)

## 2024-03-27 VITALS — Temp 97.6°F

## 2024-03-27 DIAGNOSIS — Z23 Encounter for immunization: Secondary | ICD-10-CM

## 2024-03-28 NOTE — Telephone Encounter (Signed)
 Patient advised.

## 2024-03-31 ENCOUNTER — Ambulatory Visit: Payer: Self-pay

## 2024-03-31 NOTE — Telephone Encounter (Signed)
 FYI Only or Action Required?: FYI only for provider: appointment scheduled on 11/18.  Patient was last seen in primary care on 10/30/2023 by Alvia Bring, DO.  Called Nurse Triage reporting Rib Injury.  Symptoms began several days ago.  Interventions attempted: OTC medications: tylenol .  Symptoms are: unchanged.  Triage Disposition: See Physician Within 24 Hours  Patient/caregiver understands and will follow disposition?: Yes, will follow disposition  Copied from CRM #[id number]. Topic: Clinical - Red Word Triage >> Mar 31, 2024  3:33 PM Delon HERO wrote: Red Word that prompted transfer to Nurse Triage: Patient is calling to report that she thinks she may have a rib fracture on the left side. Patient started feeling pain Friday night or Saturday. Looking for imaging on her ribs. Reason for Disposition  [1] Chest pain lasts < 5 minutes AND [2] NO chest pain or cardiac symptoms (e.g., breathing difficulty, sweating) now  (Exception: Chest pains that last only a few seconds.)  Answer Assessment - Initial Assessment Questions 1. LOCATION: Where does it hurt?       L side above waist 2. RADIATION: Does the pain go anywhere else? (e.g., into neck, jaw, arms, back)     denies 3. ONSET: When did the chest pain begin? (Minutes, hours or days)      Last couple of days 4. PATTERN: Does the pain come and go, or has it been constant since it started?  Does it get worse with exertion?      constant 5. DURATION: How long does it last (e.g., seconds, minutes, hours)     Pt states that it is constant 6. SEVERITY: How bad is the pain?  (e.g., Scale 1-10; mild, moderate, or severe)     Standing talking on the phone, doesn't bother pt, maybe a 2 or 3 7. CARDIAC RISK FACTORS: Do you have any history of heart problems or risk factors for heart disease? (e.g., angina, prior heart attack; diabetes, high blood pressure, high cholesterol, smoker, or strong family history of heart disease)      denies 8. PULMONARY RISK FACTORS: Do you have any history of lung disease?  (e.g., blood clots in lung, asthma, emphysema, birth control pills)     COPD 9. CAUSE: What do you think is causing the chest pain?     Broken rib, states she has a hx of osteoporosis, states she has missed her medications for osteoporosis.  10. OTHER SYMPTOMS: Do you have any other symptoms? (e.g., dizziness, nausea, vomiting, sweating, fever, difficulty breathing, cough)       Denies SOB, denies cough, denies redness, denies swelling, Denies dizziness, denies N/V.   Worse with movement. Pt states that tylenol  helps. Pt states that it feels like prior broken ribs. Denies injury. States that she can take deep breaths and they do not cause pain.  Protocols used: Chest Pain-A-AH

## 2024-04-01 ENCOUNTER — Ambulatory Visit (INDEPENDENT_AMBULATORY_CARE_PROVIDER_SITE_OTHER): Admitting: Family Medicine

## 2024-04-01 ENCOUNTER — Encounter: Payer: Self-pay | Admitting: Family Medicine

## 2024-04-01 ENCOUNTER — Ambulatory Visit (INDEPENDENT_AMBULATORY_CARE_PROVIDER_SITE_OTHER)

## 2024-04-01 VITALS — BP 157/83 | HR 91 | Ht 59.0 in | Wt 135.0 lb

## 2024-04-01 DIAGNOSIS — R0789 Other chest pain: Secondary | ICD-10-CM

## 2024-04-01 DIAGNOSIS — M47817 Spondylosis without myelopathy or radiculopathy, lumbosacral region: Secondary | ICD-10-CM | POA: Insufficient documentation

## 2024-04-01 DIAGNOSIS — G603 Idiopathic progressive neuropathy: Secondary | ICD-10-CM | POA: Insufficient documentation

## 2024-04-02 ENCOUNTER — Ambulatory Visit: Payer: Self-pay | Admitting: Family Medicine

## 2024-04-06 DIAGNOSIS — R0789 Other chest pain: Secondary | ICD-10-CM | POA: Insufficient documentation

## 2024-04-06 NOTE — Assessment & Plan Note (Signed)
 Chest x-ray with rib detail ordered.  She may have injured her ribs when she received a bruise on her hip.  Does not recall any trauma.

## 2024-04-06 NOTE — Progress Notes (Signed)
 Allison Zuniga - 83 y.o. female MRN [id number]  Date of birth: 31-Mar-1941  Subjective Chief Complaint  Patient presents with   Chest Pain   Bleeding/Bruising    HPI Allison Zuniga is a 83 year old female here today with complaint of left sided rib pain.  Began having rib pain a few days ago.  Tenderness to touch.  Has not noticed any rash.  Does not recall any recent injury but does have some bruising over the left hip.  She thinks her car door may have hit her.  She does not have any urinary symptoms.  Bowels moving okay.  No fever or chills.  ROS:  A comprehensive ROS was completed and negative except as noted per HPI  Allergies  Allergen Reactions   Sulfa Antibiotics Other (See Comments)    Chest tightness   Aspirin Hives   Penicillins Hives   Clindamycin/Lincomycin Other (See Comments)    C. diff    Past Medical History:  Diagnosis Date   COPD (chronic obstructive pulmonary disease) (HCC)    Former smoker    Hypertension    Hypothyroidism    Pneumonia    > 10 years ago per pt     Past Surgical History:  Procedure Laterality Date   EYE SURGERY     bilateral cataract removal 2021 per pt    KYPHOPLASTY N/A 09/24/2019   Procedure: THORACIC TWELVE, LUMBAR ONE KYPHOPLASTY;  Surgeon: Burnetta Aures, MD;  Location: [facility] OR;  Service: Orthopedics;  Laterality: N/A;  60 ins    Social History   Socioeconomic History   Marital status: Widowed    Spouse name: Tynisa Vohs   Number of children: Not on file   Years of education: Not on file   Highest education level: Not on file  Occupational History   Not on file  Tobacco Use   Smoking status: Former    Current packs/day: 0.00    Average packs/day: 1 pack/day for 30.0 years (30.0 ttl pk-yrs)    Types: Cigarettes    Start date: 21    Quit date: 2015    Years since quitting: 10.9   Smokeless tobacco: Former  Building Services Engineer status: Never Used  Substance and Sexual Activity   Alcohol use: Yes    Alcohol/week:  1.0 - 2.0 standard drink of alcohol    Types: 1 - 2 Standard drinks or equivalent per week   Drug use: Never   Sexual activity: Not Currently  Other Topics Concern   Not on file  Social History Narrative   Not on file   Social Drivers of Health   Financial Resource Strain: Low Risk  (06/10/2022)   Received from [facility]   Overall Financial Resource Strain (CARDIA)    Difficulty of Paying Living Expenses: Not hard at all  Food Insecurity: No Food Insecurity (10/27/2020)   [facility]   Hunger Vital Sign    Within the past 12 months, you worried that your food would run out before you got the money to buy more.: Never true    Within the past 12 months, the food you bought just didn't last and you didn't have money to get more.: Never true  Transportation Needs: No Transportation Needs (06/15/2022)   Received from [facility] - Transportation    Lack of Transportation (Non-Medical): No    Lack of Transportation (Medical): No  Physical Activity: Not on file  Stress: No Stress Concern Present (06/10/2022)  Received from [facility]ational Health - Occupational Stress Questionnaire    Feeling of Stress : Only a little  Social Connections: Unknown (09/27/2021)   Received from [facility]   Social Network    Social Network: Not on file    Family History  Problem Relation Age of Onset   Hypertension Mother    Asthma Mother    Hypertension Father     Health Maintenance  Topic Date Due   Medicare Annual Wellness (AWV)  03/29/2017   Zoster Vaccines- Shingrix (1 of 2) 07/02/2024 (Originally [birth year]/1960)   DTaP/Tdap/Td (2 - Tdap) 04/01/2025 (Originally 12/27/2013)   Pneumococcal Vaccine: 50+ Years (2 of 2 - PPSV23, PCV20, or PCV21) 04/01/2025 (Originally 06/21/2015)   COVID-19 Vaccine (6 - 2025-26 season) 04/17/2025 (Originally 01/14/2024)   Influenza Vaccine  Completed   Bone Density Scan  Completed   Meningococcal B Vaccine   Aged Out   Lung Cancer Screening  Discontinued     ----------------------------------------------------------------------------------------------------------------------------------------------------------------------------------------------------------------- Physical Exam BP (!) 157/83   Pulse 91   Ht 4' 11 (1.499 m)   Wt 135 lb (61.2 kg)   SpO2 95%   BMI 27.27 kg/m   Physical Exam Constitutional:      Appearance: She is well-developed.  HENT:     Head: Normocephalic and atraumatic.  Cardiovascular:     Rate and Rhythm: Normal rate and regular rhythm.  Pulmonary:     Effort: Pulmonary effort is normal.     Breath sounds: Normal breath sounds.  Musculoskeletal:     Cervical back: Neck supple.     Comments: Tenderness to palpation along the left lateral ribs.  No rash noted. Bruise over left hip.  Neurological:     Mental Status: She is alert.     ------------------------------------------------------------------------------------------------------------------------------------------------------------------------------------------------------------------- Assessment and Plan  Rib pain on left side Chest x-ray with rib detail ordered.  She may have injured her ribs when she received a bruise on her hip.  Does not recall any trauma.   No orders of the defined types were placed in this encounter.   No follow-ups on file.

## 2024-04-08 ENCOUNTER — Ambulatory Visit

## 2024-04-14 ENCOUNTER — Ambulatory Visit

## 2024-04-15 ENCOUNTER — Ambulatory Visit

## 2024-04-30 ENCOUNTER — Ambulatory Visit (INDEPENDENT_AMBULATORY_CARE_PROVIDER_SITE_OTHER): Admitting: Family Medicine

## 2024-04-30 ENCOUNTER — Encounter: Payer: Self-pay | Admitting: Family Medicine

## 2024-04-30 VITALS — BP 111/67 | HR 76 | Ht 59.0 in

## 2024-04-30 DIAGNOSIS — E538 Deficiency of other specified B group vitamins: Secondary | ICD-10-CM

## 2024-04-30 DIAGNOSIS — M459 Ankylosing spondylitis of unspecified sites in spine: Secondary | ICD-10-CM

## 2024-04-30 DIAGNOSIS — E039 Hypothyroidism, unspecified: Secondary | ICD-10-CM

## 2024-04-30 DIAGNOSIS — I10 Essential (primary) hypertension: Secondary | ICD-10-CM

## 2024-04-30 DIAGNOSIS — E785 Hyperlipidemia, unspecified: Secondary | ICD-10-CM | POA: Diagnosis not present

## 2024-04-30 DIAGNOSIS — Z23 Encounter for immunization: Secondary | ICD-10-CM

## 2024-04-30 DIAGNOSIS — M810 Age-related osteoporosis without current pathological fracture: Secondary | ICD-10-CM

## 2024-04-30 DIAGNOSIS — E559 Vitamin D deficiency, unspecified: Secondary | ICD-10-CM | POA: Diagnosis not present

## 2024-04-30 DIAGNOSIS — M1991 Primary osteoarthritis, unspecified site: Secondary | ICD-10-CM | POA: Insufficient documentation

## 2024-04-30 NOTE — Progress Notes (Unsigned)
 Allison Zuniga - 83 y.o. female MRN [id number]  Date of birth: [birth year]  Subjective Chief Complaint  Patient presents with   Hypertension    HPI Allison Zuniga is a 83 y.o. female here today for follow up visit.   She reports that she is ***.   She remains on atenolol  and irbesartan  for management of HTN.  BP is ***.  She denies symptoms including chest pain, shortness of breath, palpitations, headache or vision changes.    Feels pretty good with current strength of levothyroxine .  Normal TSH in September.   Continues to see rheumatology for ankylosing spondylitis.    Allergies[1]  Past Medical History:  Diagnosis Date   COPD (chronic obstructive pulmonary disease) (HCC)    Former smoker    Hypertension    Hypothyroidism    Pneumonia    > 10 years ago per pt     Past Surgical History:  Procedure Laterality Date   EYE SURGERY     bilateral cataract removal 2021 per pt    KYPHOPLASTY N/A 09/24/2019   Procedure: THORACIC TWELVE, LUMBAR ONE KYPHOPLASTY;  Surgeon: Burnetta Aures, MD;  Location: [facility] OR;  Service: Orthopedics;  Laterality: N/A;  60 ins    Social History   Socioeconomic History   Marital status: Widowed    Spouse name: Allison Zuniga   Number of children: Not on file   Years of education: Not on file   Highest education level: 12th grade  Occupational History   Not on file  Tobacco Use   Smoking status: Former    Current packs/day: 0.00    Average packs/day: 1 pack/day for 30.0 years (30.0 ttl pk-yrs)    Types: Cigarettes    Start date: 50    Quit date: 2015    Years since quitting: 10.9   Smokeless tobacco: Former  Building Services Engineer status: Never Used  Substance and Sexual Activity   Alcohol use: Yes    Alcohol/week: 1.0 - 2.0 standard drink of alcohol    Types: 1 - 2 Standard drinks or equivalent per week   Drug use: Never   Sexual activity: Not Currently  Other Topics Concern   Not on file  Social History Narrative    Not on file   Social Drivers of Health   Tobacco Use: Medium Risk (04/01/2024)   Patient History    Smoking Tobacco Use: Former    Smokeless Tobacco Use: Former    Passive Exposure: Not on Actuary Strain: Low Risk (04/29/2024)   Overall Financial Resource Strain (CARDIA)    Difficulty of Paying Living Expenses: Not hard at all  Food Insecurity: No Food Insecurity (04/29/2024)   Epic    Worried About Radiation Protection Practitioner of Food in the Last Year: Never true    Ran Out of Food in the Last Year: Never true  Transportation Needs: No Transportation Needs (04/29/2024)   Epic    Lack of Transportation (Medical): No    Lack of Transportation (Non-Medical): No  Physical Activity: Not on file  Stress: No Stress Concern Present (06/10/2022)   Received from [facility] of Occupational Health - Occupational Stress Questionnaire    Feeling of Stress : Only a little  Social Connections: Unknown (04/29/2024)   Social Connection and Isolation Panel    Frequency of Communication with Friends and Family: More than three times a week    Frequency of Social Gatherings with Friends and Family: Three times a week  Attends Religious Services: Not on file    Active Member of Clubs or Organizations: No    Attends Banker Meetings: Not on file    Marital Status: Widowed  Depression (PHQ2-9): Low Risk (04/01/2024)   Depression (PHQ2-9)    PHQ-2 Score: 0  Alcohol Screen: Low Risk (04/29/2024)   Alcohol Screen    Last Alcohol Screening Score (AUDIT): 1  Housing: Unknown (04/29/2024)   Epic    Unable to Pay for Housing in the Last Year: No    Number of Times Moved in the Last Year: Not on file    Homeless in the Last Year: No  Utilities: Not on file  Health Literacy: Adequate Health Literacy (10/30/2023)   B1300 Health Literacy    Frequency of need for help with medical instructions: Never    Family History  Problem Relation Age  of Onset   Hypertension Mother    Asthma Mother    Hypertension Father     Health Maintenance  Topic Date Due   Medicare Annual Wellness (AWV)  03/29/2017   Zoster Vaccines- Shingrix (1 of 2) 07/02/2024 (Originally 08/22/1959)   DTaP/Tdap/Td (2 - Tdap) 04/01/2025 (Originally 12/27/2013)   Pneumococcal Vaccine: 50+ Years (2 of 2 - PPSV23, PCV20, or PCV21) 04/01/2025 (Originally 06/21/2015)   COVID-19 Vaccine (6 - 2025-26 season) 04/17/2025 (Originally 01/14/2024)   Influenza Vaccine  Completed   Bone Density Scan  Completed   Meningococcal B Vaccine  Aged Out   Lung Cancer Screening  Discontinued     ----------------------------------------------------------------------------------------------------------------------------------------------------------------------------------------------------------------- Physical Exam There were no vitals taken for this visit.  Physical Exam  ------------------------------------------------------------------------------------------------------------------------------------------------------------------------------------------------------------------- Assessment and Plan  No problem-specific Assessment & Plan notes found for this encounter.   No orders of the defined types were placed in this encounter.   No follow-ups on file.          [1] Allergies Allergen Reactions   Sulfa Antibiotics Other (See Comments)    Chest tightness   Aspirin Hives   Penicillins Hives   Clindamycin/Lincomycin Other (See Comments)    C. diff

## 2024-05-04 NOTE — Assessment & Plan Note (Signed)
Rechecking B12 levels

## 2024-05-04 NOTE — Assessment & Plan Note (Signed)
Updated lipid panel today.

## 2024-05-04 NOTE — Assessment & Plan Note (Signed)
 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

## 2024-05-04 NOTE — Assessment & Plan Note (Signed)
 Update vitamin D  levels.

## 2024-05-04 NOTE — Assessment & Plan Note (Signed)
Blood pressure is well-controlled at this time.  Recommend continuation of current medications. 

## 2024-05-04 NOTE — Assessment & Plan Note (Signed)
Continues on methotrexate for management of this.  Management per rheumatology.

## 2024-05-04 NOTE — Assessment & Plan Note (Signed)
 Update TSH.  Feels pretty good with current strength of levothyroxine .

## 2024-05-20 ENCOUNTER — Ambulatory Visit

## 2024-05-20 DIAGNOSIS — G8929 Other chronic pain: Secondary | ICD-10-CM

## 2024-05-20 DIAGNOSIS — M546 Pain in thoracic spine: Secondary | ICD-10-CM

## 2024-05-20 DIAGNOSIS — M450 Ankylosing spondylitis of multiple sites in spine: Secondary | ICD-10-CM

## 2024-05-20 DIAGNOSIS — M810 Age-related osteoporosis without current pathological fracture: Secondary | ICD-10-CM

## 2024-05-20 LAB — CMP14+EGFR
ALT: 13 IU/L (ref 0–32)
AST: 19 IU/L (ref 0–40)
Albumin: 4.2 g/dL (ref 3.7–4.7)
Alkaline Phosphatase: 127 IU/L (ref 48–129)
BUN/Creatinine Ratio: 30 — ABNORMAL HIGH (ref 12–28)
BUN: 18 mg/dL (ref 8–27)
Bilirubin Total: 0.5 mg/dL (ref 0.0–1.2)
CO2: 31 mmol/L — ABNORMAL HIGH (ref 20–29)
Calcium: 10.1 mg/dL (ref 8.7–10.3)
Chloride: 99 mmol/L (ref 96–106)
Creatinine, Ser: 0.6 mg/dL (ref 0.57–1.00)
Globulin, Total: 2.8 g/dL (ref 1.5–4.5)
Glucose: 82 mg/dL (ref 70–99)
Potassium: 4.1 mmol/L (ref 3.5–5.2)
Sodium: 142 mmol/L (ref 134–144)
Total Protein: 7 g/dL (ref 6.0–8.5)
eGFR: 90 mL/min/1.73

## 2024-05-20 LAB — CBC WITH DIFFERENTIAL/PLATELET
Basophils Absolute: 0.1 x10E3/uL (ref 0.0–0.2)
Basos: 1 %
EOS (ABSOLUTE): 0.1 x10E3/uL (ref 0.0–0.4)
Eos: 2 %
Hematocrit: 40.3 % (ref 34.0–46.6)
Hemoglobin: 12.9 g/dL (ref 11.1–15.9)
Immature Granulocytes: 0 %
Lymphocytes Absolute: 2.4 x10E3/uL (ref 0.7–3.1)
Lymphs: 28 %
MCH: 30.4 pg (ref 26.6–33.0)
MCHC: 32 g/dL (ref 31.5–35.7)
MCV: 95 fL (ref 79–97)
Monocytes Absolute: 0.6 x10E3/uL (ref 0.1–0.9)
Monocytes: 7 %
Neutrophils Absolute: 5.3 x10E3/uL (ref 1.4–7.0)
Neutrophils: 62 %
Platelets: 371 x10E3/uL (ref 150–450)
RBC: 4.24 x10E6/uL (ref 3.77–5.28)
RDW: 13.8 % (ref 11.7–15.4)
WBC: 8.4 x10E3/uL (ref 3.4–10.8)

## 2024-05-20 NOTE — Addendum Note (Signed)
 Addended by: Nihal Marzella Z on: 05/20/2024 04:37 PM   Modules accepted: Orders

## 2024-05-21 ENCOUNTER — Ambulatory Visit: Admitting: Family Medicine

## 2024-05-21 LAB — TSH: TSH: 2.18 u[IU]/mL (ref 0.450–4.500)

## 2024-05-21 LAB — B12 AND FOLATE PANEL
Folate: >20 ng/mL
Vitamin B-12: 953 pg/mL (ref 232–1245)

## 2024-05-21 LAB — LIPID PANEL WITH LDL/HDL RATIO
Cholesterol, Total: 219 mg/dL — ABNORMAL HIGH (ref 100–199)
HDL: 69 mg/dL
LDL Chol Calc (NIH): 135 mg/dL — ABNORMAL HIGH (ref 0–99)
LDL/HDL Ratio: 2 ratio (ref 0.0–3.2)
Triglycerides: 88 mg/dL (ref 0–149)
VLDL Cholesterol Cal: 15 mg/dL (ref 5–40)

## 2024-05-21 LAB — VITAMIN D 25 HYDROXY (VIT D DEFICIENCY, FRACTURES): Vit D, 25-Hydroxy: 81.5 ng/mL (ref 30.0–100.0)

## 2024-05-26 ENCOUNTER — Telehealth: Payer: Self-pay

## 2024-05-26 NOTE — Telephone Encounter (Signed)
 Copied from CRM [id number]. Topic: Clinical - Lab/Test Results >> May 26, 2024  9:36 AM Allison Zuniga wrote: Reason for CRM: Patient calling to get results of the imaging studies and labs complete 05/20/2024. Patient CB# [phone]. Okay to reply in [place].

## 2024-05-26 NOTE — Telephone Encounter (Signed)
 Called and changed to STAT read.

## 2024-05-27 ENCOUNTER — Ambulatory Visit: Payer: Self-pay | Admitting: Family Medicine

## 2024-05-27 ENCOUNTER — Other Ambulatory Visit: Payer: Self-pay | Admitting: Family Medicine

## 2024-05-27 MED ORDER — TRELEGY ELLIPTA 100-62.5-25 MCG/ACT IN AEPB: 1.0000 | INHALATION_SPRAY | Freq: Every day | RESPIRATORY_TRACT | 11 refills | Status: AC

## 2024-05-27 NOTE — Telephone Encounter (Signed)
 Copied from CRM [id number]. Topic: Clinical - Medication Refill >> May 27, 2024 10:44 AM Sophia H wrote: Medication: Fluticasone-Umeclidin-Vilant (TRELEGY ELLIPTA ) 100-62.5-25 MCG/ACT AEPB   Has the patient contacted their pharmacy? No, will need RX from PCP. Patient is wanting to know if PCP will pick up RX moving forward and send over with a couple of refills.   This is the patient's preferred pharmacy:  [facility] [address], [place] - [address] [place] [address] Phone: [phone] Fax: [phone]  Is this the correct pharmacy for this prescription? Yes If no, delete pharmacy and type the correct one.   Has the prescription been filled recently? Yes  Is the patient out of the medication? Yes  Has the patient been seen for an appointment in the last year OR does the patient have an upcoming appointment? Yes, seen in December.   Can we respond through MyChart? No. Prefers phone call.   Agent: Please be advised that Rx refills may take up to 3 business days. We ask that you follow-up with your pharmacy.

## 2024-05-27 NOTE — Telephone Encounter (Signed)
 Has the patient contacted their pharmacy? No, will need RX from PCP. Patient is wanting to know if PCP will pick up RX moving forward and send over with a couple of refills.

## 2024-05-29 ENCOUNTER — Telehealth: Payer: Self-pay

## 2024-05-29 ENCOUNTER — Other Ambulatory Visit: Payer: Self-pay | Admitting: Family Medicine

## 2024-05-29 DIAGNOSIS — I10 Essential (primary) hypertension: Secondary | ICD-10-CM

## 2024-05-29 NOTE — Telephone Encounter (Signed)
 Received fax from [facility] requesting Trelegy refill. Denied, as pt has not been seen since 2022 and has no upcoming appts.

## 2024-05-30 ENCOUNTER — Ambulatory Visit: Payer: Self-pay | Admitting: Family Medicine

## 2024-10-31 ENCOUNTER — Ambulatory Visit: Admitting: Family Medicine
